# Patient Record
Sex: Female | Born: 1935
Health system: Southern US, Community
[De-identification: ages and names within clinical notes are randomized; demographics above are authoritative.]

## PROBLEM LIST (undated history)

## (undated) DIAGNOSIS — Z9849 Cataract extraction status, unspecified eye: Secondary | ICD-10-CM

## (undated) DIAGNOSIS — M810 Age-related osteoporosis without current pathological fracture: Secondary | ICD-10-CM

## (undated) DIAGNOSIS — E039 Hypothyroidism, unspecified: Secondary | ICD-10-CM

## (undated) DIAGNOSIS — M722 Plantar fascial fibromatosis: Secondary | ICD-10-CM

## (undated) DIAGNOSIS — J449 Chronic obstructive pulmonary disease, unspecified: Secondary | ICD-10-CM

## (undated) DIAGNOSIS — M19049 Primary osteoarthritis, unspecified hand: Secondary | ICD-10-CM

## (undated) DIAGNOSIS — G500 Trigeminal neuralgia: Secondary | ICD-10-CM

## (undated) DIAGNOSIS — Z86010 Personal history of colonic polyps: Secondary | ICD-10-CM

## (undated) DIAGNOSIS — I10 Essential (primary) hypertension: Secondary | ICD-10-CM

## (undated) DIAGNOSIS — E785 Hyperlipidemia, unspecified: Secondary | ICD-10-CM

## (undated) DIAGNOSIS — Z9089 Acquired absence of other organs: Secondary | ICD-10-CM

## (undated) DIAGNOSIS — E119 Type 2 diabetes mellitus without complications: Secondary | ICD-10-CM

## (undated) DIAGNOSIS — I82409 Acute embolism and thrombosis of unspecified deep veins of unspecified lower extremity: Secondary | ICD-10-CM

## (undated) DIAGNOSIS — R51 Headache: Secondary | ICD-10-CM

## (undated) DIAGNOSIS — Z9079 Acquired absence of other genital organ(s): Secondary | ICD-10-CM

## (undated) HISTORY — PX: ABDOMINAL HYSTERECTOMY: SHX81

## (undated) HISTORY — DX: Trigeminal neuralgia: G50.0

## (undated) HISTORY — DX: Acquired absence of other organs: Z90.89

## (undated) HISTORY — DX: Hypothyroidism, unspecified: E03.9

## (undated) HISTORY — PX: CATARACT EXTRACTION: SUR2

## (undated) HISTORY — DX: Essential (primary) hypertension: I10

## (undated) HISTORY — DX: Headache: R51

## (undated) HISTORY — DX: Personal history of colonic polyps: Z86.010

## (undated) HISTORY — DX: Acquired absence of other genital organ(s): Z90.79

## (undated) HISTORY — DX: Age-related osteoporosis without current pathological fracture: M81.0

## (undated) HISTORY — DX: Hyperlipidemia, unspecified: E78.5

## (undated) HISTORY — PX: THYROIDECTOMY: SHX17

## (undated) HISTORY — DX: Chronic obstructive pulmonary disease, unspecified: J44.9

## (undated) HISTORY — DX: Acute embolism and thrombosis of unspecified deep veins of unspecified lower extremity: I82.409

## (undated) HISTORY — DX: Primary osteoarthritis, unspecified hand: M19.049

## (undated) HISTORY — DX: Cataract extraction status, unspecified eye: Z98.49

## (undated) HISTORY — DX: Type 2 diabetes mellitus without complications: E11.9

## (undated) HISTORY — DX: Plantar fascial fibromatosis: M72.2

---

## 1999-04-11 ENCOUNTER — Encounter: Admission: RE | Admit: 1999-04-11 | Discharge: 1999-04-11 | Payer: Self-pay | Admitting: Internal Medicine

## 1999-04-11 ENCOUNTER — Encounter: Payer: Self-pay | Admitting: Internal Medicine

## 2000-05-18 ENCOUNTER — Encounter: Payer: Self-pay | Admitting: Internal Medicine

## 2000-05-18 ENCOUNTER — Ambulatory Visit (HOSPITAL_COMMUNITY): Admission: RE | Admit: 2000-05-18 | Discharge: 2000-05-18 | Payer: Self-pay | Admitting: Internal Medicine

## 2001-04-04 ENCOUNTER — Encounter: Payer: Self-pay | Admitting: Internal Medicine

## 2001-04-04 ENCOUNTER — Encounter: Admission: RE | Admit: 2001-04-04 | Discharge: 2001-04-04 | Payer: Self-pay | Admitting: Internal Medicine

## 2002-03-13 ENCOUNTER — Inpatient Hospital Stay (HOSPITAL_COMMUNITY): Admission: EM | Admit: 2002-03-13 | Discharge: 2002-03-16 | Payer: Self-pay | Admitting: Internal Medicine

## 2002-03-13 ENCOUNTER — Encounter: Payer: Self-pay | Admitting: Internal Medicine

## 2002-11-02 ENCOUNTER — Encounter: Payer: Self-pay | Admitting: Internal Medicine

## 2002-11-02 ENCOUNTER — Encounter: Admission: RE | Admit: 2002-11-02 | Discharge: 2002-11-02 | Payer: Self-pay | Admitting: Internal Medicine

## 2003-06-13 ENCOUNTER — Ambulatory Visit (HOSPITAL_COMMUNITY): Admission: RE | Admit: 2003-06-13 | Discharge: 2003-06-13 | Payer: Self-pay | Admitting: Internal Medicine

## 2003-11-08 ENCOUNTER — Encounter: Admission: RE | Admit: 2003-11-08 | Discharge: 2003-11-08 | Payer: Self-pay | Admitting: Internal Medicine

## 2003-11-22 ENCOUNTER — Ambulatory Visit: Payer: Self-pay | Admitting: Internal Medicine

## 2003-11-27 ENCOUNTER — Ambulatory Visit: Payer: Self-pay | Admitting: Internal Medicine

## 2003-12-25 ENCOUNTER — Ambulatory Visit: Payer: Self-pay | Admitting: Internal Medicine

## 2004-02-27 ENCOUNTER — Emergency Department (HOSPITAL_COMMUNITY): Admission: EM | Admit: 2004-02-27 | Discharge: 2004-02-27 | Payer: Self-pay | Admitting: Emergency Medicine

## 2004-03-07 ENCOUNTER — Ambulatory Visit: Payer: Self-pay | Admitting: Internal Medicine

## 2004-07-29 ENCOUNTER — Ambulatory Visit: Payer: Self-pay | Admitting: Internal Medicine

## 2004-11-26 ENCOUNTER — Ambulatory Visit: Payer: Self-pay | Admitting: Internal Medicine

## 2004-12-02 ENCOUNTER — Ambulatory Visit: Payer: Self-pay | Admitting: Internal Medicine

## 2004-12-05 ENCOUNTER — Ambulatory Visit: Payer: Self-pay

## 2005-01-19 ENCOUNTER — Encounter: Admission: RE | Admit: 2005-01-19 | Discharge: 2005-01-19 | Payer: Self-pay | Admitting: Internal Medicine

## 2005-05-11 ENCOUNTER — Ambulatory Visit: Payer: Self-pay | Admitting: Internal Medicine

## 2005-05-12 ENCOUNTER — Ambulatory Visit: Payer: Self-pay | Admitting: Internal Medicine

## 2005-11-30 ENCOUNTER — Ambulatory Visit: Payer: Self-pay | Admitting: Internal Medicine

## 2005-12-02 ENCOUNTER — Ambulatory Visit: Payer: Self-pay | Admitting: Internal Medicine

## 2005-12-08 ENCOUNTER — Ambulatory Visit: Payer: Self-pay | Admitting: Internal Medicine

## 2005-12-17 ENCOUNTER — Ambulatory Visit: Payer: Self-pay | Admitting: Internal Medicine

## 2006-01-21 ENCOUNTER — Encounter: Admission: RE | Admit: 2006-01-21 | Discharge: 2006-01-21 | Payer: Self-pay | Admitting: Internal Medicine

## 2006-06-25 ENCOUNTER — Ambulatory Visit: Payer: Self-pay | Admitting: Internal Medicine

## 2006-06-25 LAB — CONVERTED CEMR LAB
ALT: 25 units/L (ref 0–40)
BUN: 37 mg/dL — ABNORMAL HIGH (ref 6–23)
CO2: 29 meq/L (ref 19–32)
Creatinine,U: 110.1 mg/dL
GFR calc Af Amer: 46 mL/min
GFR calc non Af Amer: 38 mL/min
Microalb, Ur: 0.2 mg/dL (ref 0.0–1.9)
Sodium: 138 meq/L (ref 135–145)

## 2006-07-14 ENCOUNTER — Ambulatory Visit: Payer: Self-pay | Admitting: Internal Medicine

## 2006-09-21 ENCOUNTER — Encounter: Payer: Self-pay | Admitting: Internal Medicine

## 2006-09-21 DIAGNOSIS — E039 Hypothyroidism, unspecified: Secondary | ICD-10-CM

## 2006-09-21 DIAGNOSIS — Z9079 Acquired absence of other genital organ(s): Secondary | ICD-10-CM

## 2006-09-21 DIAGNOSIS — E1159 Type 2 diabetes mellitus with other circulatory complications: Secondary | ICD-10-CM

## 2006-09-21 DIAGNOSIS — J4489 Other specified chronic obstructive pulmonary disease: Secondary | ICD-10-CM

## 2006-09-21 DIAGNOSIS — Z86718 Personal history of other venous thrombosis and embolism: Secondary | ICD-10-CM | POA: Insufficient documentation

## 2006-09-21 DIAGNOSIS — Z9089 Acquired absence of other organs: Secondary | ICD-10-CM

## 2006-09-21 DIAGNOSIS — E119 Type 2 diabetes mellitus without complications: Secondary | ICD-10-CM

## 2006-09-21 DIAGNOSIS — I10 Essential (primary) hypertension: Secondary | ICD-10-CM

## 2006-09-21 DIAGNOSIS — M810 Age-related osteoporosis without current pathological fracture: Secondary | ICD-10-CM

## 2006-09-21 DIAGNOSIS — Z860100 Personal history of colon polyps, unspecified: Secondary | ICD-10-CM

## 2006-09-21 DIAGNOSIS — Z9849 Cataract extraction status, unspecified eye: Secondary | ICD-10-CM

## 2006-09-21 DIAGNOSIS — J441 Chronic obstructive pulmonary disease with (acute) exacerbation: Secondary | ICD-10-CM | POA: Insufficient documentation

## 2006-09-21 DIAGNOSIS — E785 Hyperlipidemia, unspecified: Secondary | ICD-10-CM

## 2006-09-21 DIAGNOSIS — J449 Chronic obstructive pulmonary disease, unspecified: Secondary | ICD-10-CM

## 2006-09-21 DIAGNOSIS — M722 Plantar fascial fibromatosis: Secondary | ICD-10-CM

## 2006-09-21 DIAGNOSIS — I82409 Acute embolism and thrombosis of unspecified deep veins of unspecified lower extremity: Secondary | ICD-10-CM

## 2006-09-21 DIAGNOSIS — E1169 Type 2 diabetes mellitus with other specified complication: Secondary | ICD-10-CM | POA: Insufficient documentation

## 2006-09-21 DIAGNOSIS — E890 Postprocedural hypothyroidism: Secondary | ICD-10-CM

## 2006-09-21 HISTORY — DX: Hypothyroidism, unspecified: E03.9

## 2006-09-21 HISTORY — DX: Plantar fascial fibromatosis: M72.2

## 2006-09-21 HISTORY — DX: Age-related osteoporosis without current pathological fracture: M81.0

## 2006-09-21 HISTORY — DX: Chronic obstructive pulmonary disease, unspecified: J44.9

## 2006-09-21 HISTORY — DX: Cataract extraction status, unspecified eye: Z98.49

## 2006-09-21 HISTORY — DX: Hyperlipidemia, unspecified: E78.5

## 2006-09-21 HISTORY — DX: Personal history of colonic polyps: Z86.010

## 2006-09-21 HISTORY — DX: Type 2 diabetes mellitus without complications: E11.9

## 2006-09-21 HISTORY — DX: Acquired absence of other organs: Z90.89

## 2006-09-21 HISTORY — DX: Essential (primary) hypertension: I10

## 2006-09-21 HISTORY — DX: Other specified chronic obstructive pulmonary disease: J44.89

## 2006-09-21 HISTORY — DX: Personal history of colon polyps, unspecified: Z86.0100

## 2006-09-21 HISTORY — DX: Acquired absence of other genital organ(s): Z90.79

## 2006-09-21 HISTORY — DX: Acute embolism and thrombosis of unspecified deep veins of unspecified lower extremity: I82.409

## 2007-01-21 ENCOUNTER — Ambulatory Visit: Payer: Self-pay | Admitting: Internal Medicine

## 2007-01-21 LAB — CONVERTED CEMR LAB
ALT: 24 units/L (ref 0–35)
AST: 26 units/L (ref 0–37)
BUN: 24 mg/dL — ABNORMAL HIGH (ref 6–23)
Bilirubin, Direct: 0.1 mg/dL (ref 0.0–0.3)
CO2: 28 meq/L (ref 19–32)
Calcium: 10.4 mg/dL (ref 8.4–10.5)
Creatinine, Ser: 1.1 mg/dL (ref 0.4–1.2)
Direct LDL: 80.2 mg/dL
HDL: 37.7 mg/dL — ABNORMAL LOW (ref >39.0)
VLDL: 47 mg/dL — ABNORMAL HIGH (ref 0–40)

## 2007-01-23 ENCOUNTER — Encounter: Payer: Self-pay | Admitting: Internal Medicine

## 2007-01-26 ENCOUNTER — Encounter: Admission: RE | Admit: 2007-01-26 | Discharge: 2007-01-26 | Payer: Self-pay | Admitting: Internal Medicine

## 2007-06-13 ENCOUNTER — Ambulatory Visit: Payer: Self-pay | Admitting: Internal Medicine

## 2007-06-13 DIAGNOSIS — M19049 Primary osteoarthritis, unspecified hand: Secondary | ICD-10-CM

## 2007-06-13 DIAGNOSIS — R51 Headache: Secondary | ICD-10-CM

## 2007-06-13 DIAGNOSIS — R519 Headache, unspecified: Secondary | ICD-10-CM | POA: Insufficient documentation

## 2007-06-13 HISTORY — DX: Headache: R51

## 2007-06-13 HISTORY — DX: Primary osteoarthritis, unspecified hand: M19.049

## 2007-06-20 ENCOUNTER — Ambulatory Visit: Payer: Self-pay | Admitting: Internal Medicine

## 2007-06-20 LAB — CONVERTED CEMR LAB: Hgb A1c MFr Bld: 7.8 % — ABNORMAL HIGH (ref 4.6–6.0)

## 2007-06-27 ENCOUNTER — Encounter: Payer: Self-pay | Admitting: Internal Medicine

## 2008-01-23 ENCOUNTER — Ambulatory Visit: Payer: Self-pay | Admitting: Internal Medicine

## 2008-01-23 LAB — CONVERTED CEMR LAB
BUN: 24 mg/dL — ABNORMAL HIGH (ref 6–23)
Calcium: 10 mg/dL (ref 8.4–10.5)
Chloride: 105 meq/L (ref 96–112)
Creatinine, Ser: 0.8 mg/dL (ref 0.4–1.2)
GFR calc non Af Amer: 72 mL/min
Glucose, Bld: 117 mg/dL — ABNORMAL HIGH (ref 70–99)
Potassium: 5.3 meq/L — ABNORMAL HIGH (ref 3.5–5.1)

## 2008-02-01 ENCOUNTER — Encounter: Admission: RE | Admit: 2008-02-01 | Discharge: 2008-02-01 | Payer: Self-pay | Admitting: Internal Medicine

## 2008-10-01 ENCOUNTER — Ambulatory Visit: Payer: Self-pay | Admitting: Internal Medicine

## 2008-10-01 DIAGNOSIS — G500 Trigeminal neuralgia: Secondary | ICD-10-CM

## 2008-10-01 HISTORY — DX: Trigeminal neuralgia: G50.0

## 2009-01-28 ENCOUNTER — Ambulatory Visit: Payer: Self-pay | Admitting: Internal Medicine

## 2009-01-28 LAB — CONVERTED CEMR LAB
ALT: 27 units/L (ref 0–35)
Bilirubin, Direct: 0.1 mg/dL (ref 0.0–0.3)
CO2: 26 meq/L (ref 19–32)
Calcium: 10.1 mg/dL (ref 8.4–10.5)
Chloride: 105 meq/L (ref 96–112)
Creatinine, Ser: 1.4 mg/dL — ABNORMAL HIGH (ref 0.4–1.2)
GFR calc non Af Amer: 37.55 mL/min (ref >60)
Hgb A1c MFr Bld: 7.6 % — ABNORMAL HIGH (ref 4.6–6.5)
Total Bilirubin: 0.4 mg/dL (ref 0.3–1.2)
VLDL: 32 mg/dL (ref 0.0–40.0)

## 2009-02-04 ENCOUNTER — Encounter: Admission: RE | Admit: 2009-02-04 | Discharge: 2009-02-04 | Payer: Self-pay | Admitting: Internal Medicine

## 2009-02-04 ENCOUNTER — Encounter: Payer: Self-pay | Admitting: Internal Medicine

## 2009-02-11 ENCOUNTER — Telehealth: Payer: Self-pay | Admitting: Internal Medicine

## 2009-02-15 ENCOUNTER — Telehealth: Payer: Self-pay | Admitting: Internal Medicine

## 2009-03-21 ENCOUNTER — Telehealth: Payer: Self-pay | Admitting: Internal Medicine

## 2009-04-22 ENCOUNTER — Ambulatory Visit: Payer: Self-pay | Admitting: Internal Medicine

## 2009-04-22 LAB — CONVERTED CEMR LAB
Total CHOL/HDL Ratio: 4
Triglycerides: 340 mg/dL — ABNORMAL HIGH (ref 0.0–149.0)

## 2009-04-28 ENCOUNTER — Telehealth: Payer: Self-pay | Admitting: Internal Medicine

## 2009-05-02 ENCOUNTER — Telehealth: Payer: Self-pay | Admitting: Internal Medicine

## 2009-05-27 ENCOUNTER — Ambulatory Visit: Payer: Self-pay | Admitting: Internal Medicine

## 2009-12-26 ENCOUNTER — Encounter: Payer: Self-pay | Admitting: Internal Medicine

## 2009-12-26 ENCOUNTER — Ambulatory Visit: Payer: Self-pay | Admitting: Internal Medicine

## 2009-12-26 LAB — CONVERTED CEMR LAB
Calcium: 10.7 mg/dL — ABNORMAL HIGH (ref 8.4–10.5)
Direct LDL: 92.1 mg/dL
Glucose, Bld: 166 mg/dL — ABNORMAL HIGH (ref 70–99)
HDL: 41.3 mg/dL (ref >39.00)
Sodium: 142 meq/L (ref 135–145)
TSH: 0.82 microintl units/mL (ref 0.35–5.50)
Total CHOL/HDL Ratio: 4
VLDL: 43.6 mg/dL — ABNORMAL HIGH (ref 0.0–40.0)

## 2010-02-05 ENCOUNTER — Encounter
Admission: RE | Admit: 2010-02-05 | Discharge: 2010-02-05 | Payer: Self-pay | Source: Home / Self Care | Attending: Internal Medicine | Admitting: Internal Medicine

## 2010-02-05 LAB — HM MAMMOGRAPHY: HM Mammogram: NEGATIVE

## 2010-02-13 NOTE — Assessment & Plan Note (Signed)
Summary: YEARLY FU/ MEDICARE / LABS LATER/ NWS   Vital Signs:  Patient profile:   77 year old female Height:      63 inches Weight:      139 pounds BMI:     24.71 O2 Sat:      95 % on Room air Temp:     98.0 degrees F oral Pulse rate:   81 / minute BP sitting:   190 / 64  (left arm) Cuff size:   regular  Vitals Entered By: Bill Salinas CMA (December 26, 2009 10:09 AM)  O2 Flow:  Room air  Primary Care Provider:  Elnor Renovato   History of Present Illness: Patient presents for a wellness exam. She has turned 89. She remains independent and very active. She reports some memory issues but manages all her own affairs, remembers birthdays and can name all 18 grandchildren and 16 great-grandchildren. She is cognitively doing well  She has a c/o paresthesia of the entire left arm that can last up to 3 hrs and can occur several times a day. She does drop objects when her arm is numb. She denies any neck pain. She does have OA in both hands that contributes to her poor grasp.  She does remain independent in ADLs except for driving. She has no signs of depression. She has not had any falls although she is at risk due to age and arthritis. Can walk to the mailbox and back - 100yds or more.   Preventive Screening-Counseling & Management  Alcohol-Tobacco     Alcohol drinks/day: 0     Smoking Status: quit     Year Quit: 1970s  Caffeine-Diet-Exercise     Caffeine use/day: 2 cups per day     Does Patient Exercise: yes  Hep-HIV-STD-Contraception     Hepatitis Risk: no risk noted     HIV Risk: no risk noted     STD Risk: no risk noted     Dental Visit-last 6 months no     Sun Exposure-Excessive: no  Safety-Violence-Falls     Seat Belt Use: yes     Helmet Use: n/a     Firearms in the Home: no firearms in the home     Smoke Detectors: yes     Violence in the Home: no risk noted     Sexual Abuse: no     Fall Risk: slight fall risk      Drug Use:  never.        Blood Transfusions:  yes  and prior to 1987.    Current Medications (verified): 1)  Synthroid 100 Mcg  Tabs (Levothyroxine Sodium) .... Take One Tablet Daily 2)  Humulin 70/30 70-30 %  Susp (Insulin Isophane & Regular) .... Take 35 Units Am 20 Units Pm 3)  Furosemide 40 Mg  Tabs (Furosemide) .... Take One Tablet Daily 4)  Advair Diskus 250-50 Mcg/dose  Misc (Fluticasone-Salmeterol) .... Take One Puff Daily 5)  Aspirin Low Dose 81 Mg Tbec (Aspirin) .... Three Aspirins A Day 6)  Oscal 500/200 D-3 500-200 Mg-Unit  Tabs (Calcium-Vitamin D) .... Take One Tablet Once Daily 7)  Multivitamins   Tabs (Multiple Vitamin) .... Take One Tablet Once Daily 8)  Vitamin E 400 Unit  Caps (Vitamin E) .... Take One Tablet Once Daily 9)  Amitriptyline Hcl 25 Mg  Tabs (Amitriptyline Hcl) .... Take 1-2 Tablets At Bedtime As Needed 10)  Biotin 1000 Mcg  Tabs (Biotin) .Marland Kitchen.. 1 Once Daily 11)  Bd Insulin Syringe  Ultrafine 30g X 1/2" 0.5 Ml  Misc (Insulin Syringe-Needle U-100) .... Use As Directed 12)  Onetouch Ultra Test   Strp (Glucose Blood) .... Test Blood Glucose Levels Two Times A Day 13)  Omega-3 1000 Mg Caps (Omega-3 Fatty Acids) .... Take 1 Tablet By Mouth Once A Day 14)  Amlodipine Besylate 5 Mg Tabs (Amlodipine Besylate) .Marland Kitchen.. 1 By Mouth Once Daily 15)  Simvastatin 40 Mg Tabs (Simvastatin) .Marland Kitchen.. 1 Daily 16)  Diovan 160 Mg Tabs (Valsartan) .Marland Kitchen.. 1 Daily  Allergies (verified): 1)  ! Codeine  Past History:  Past Medical History: Last updated: 09/21/2006 OSTEOPOROSIS (ICD-733.00) Hx of PLANTAR FASCIITIS (ICD-728.71) Hx of DVT (ICD-453.40) COLONIC POLYPS, HX OF (ICD-V12.72) HYPERTENSION (ICD-401.9) HYPERLIPIDEMIA (ICD-272.4) HYPOTHYROIDISM (ICD-244.9) DIABETES MELLITUS (ICD-250.00) COPD (ICD-496)    Family History: Last updated: 01/21/2007 non-contributory  Social History: Last updated: 01/28/2009 Widowed. Lives alone, very independent, continues to garden, does all her own housework, avid reader [employer]  (01/28/09) in a discussion about the recent death of her sister she is very clear that she does not want heroic measures: no CPR, DNI, no other heroic or futile measures. Will send a packet with Out of facility order, "Layment's Guide," MOST form.  Social History: Caffeine use/day:  2 cups per day Does Patient Exercise:  yes Dental Care w/in 6 mos.:  no Sun Exposure-Excessive:  no Seat Belt Use:  yes Fall Risk:  slight fall risk Blood Transfusions:  yes, prior to 1987 Hepatitis Risk:  no risk noted HIV Risk:  no risk noted STD Risk:  no risk noted Drug Use:  never  Review of Systems  The patient denies anorexia, fever, weight loss, weight gain, vision loss, decreased hearing, chest pain, syncope, dyspnea on exertion, prolonged cough, headaches, abdominal pain, severe indigestion/heartburn, incontinence, muscle weakness, transient blindness, difficulty walking, unusual weight change, abnormal bleeding, and enlarged lymph nodes.    Physical Exam  General:  alert, well-developed, well-nourished, well-hydrated, and normal appearance nanogenarian.   Head:  normocephalic, atraumatic, and no abnormalities observed.   Eyes:  vision grossly intact, pupils equal, pupils round, and corneas and lenses clear.   Ears:  hearing aids in both ears Nose:  no external deformity and no external erythema.   Mouth:  edentulous with dentures in place. No buccal lesions. throat clear Neck:  supple and full ROM.   Chest Wall:  no deformities and no tenderness.   Breasts:  No mass, nodules, thickening, tenderness, bulging, retraction, inflamation, nipple discharge or skin changes noted.   Lungs:  Normal respiratory effort, chest expands symmetrically. Lungs are clear to auscultation, no crackles or wheezes. Heart:  normal rate, regular rhythm, no murmur, and no JVD.   Abdomen:  soft, non-tender, normal bowel sounds, and no guarding.   Msk:  deformity of DIP,PIP joints both hands. Good mobility of hands noted.  Major joints otherwise normal Pulses:  2+ raidal and 1+ DP pulses Extremities:  No clubbing, cyanosis, edema, or deformity noted with normal full range of motion of all joints.   Neurologic:  alert & oriented X3, cranial nerves II-XII intact, strength normal in all extremities, and DTRs symmetrical and normal.  Minimal decrease in light touch fingers of left hand. Equal sensation to pin-prick.  Skin:  turgor normal, color normal, and no suspicious lesions.   Cervical Nodes:  no anterior cervical adenopathy and no posterior cervical adenopathy.   Psych:  Oriented X3, memory intact for recent and remote, normally interactive, and good eye contact.  Clockface - poor layout of numbers got hands right.  recall 3/3 words at 5 minutes   Impression & Recommendations:  Problem # 1:  PRIMARY LOCALIZED OSTEOARTHROSIS HAND (ICD-715.14) patinet with DIP and PIP deformities. Normal ability tomake a fist and 4+/5 grip strenth.  Her updated medication list for this problem includes:    Aspirin Low Dose 81 Mg Tbec (Aspirin) .Marland Kitchen... Three aspirins a day  Problem # 2:  HYPERTENSION (ICD-401.9)  Her updated medication list for this problem includes:    Furosemide 40 Mg Tabs (Furosemide) .Marland Kitchen... Take one tablet daily    Amlodipine Besylate 5 Mg Tabs (Amlodipine besylate) .Marland Kitchen... 1 by mouth once daily    Diovan 160 Mg Tabs (Valsartan) .Marland Kitchen... 1 daily  Orders: TLB-BMP (Basic Metabolic Panel-BMET) (80048-METABOL)  BP today: 190/64 Prior BP: 126/82 (05/27/2009)  Labs Reviewed: K+: 4.2 (01/28/2009) Creat: : 1.4 (01/28/2009)     BP very high at today's exam but usually well controlled. She is on three agents.  Plan - observation: she should check BP at her convenience toinsure that it is usually controlled.  Problem # 3:  HYPERLIPIDEMIA (ICD-272.4) Due for lipid panel with recommendations to follow.  Her updated medication list for this problem includes:    Simvastatin 40 Mg Tabs (Simvastatin) .Marland Kitchen... 1  daily  Orders: TLB-Lipid Panel (80061-LIPID)  Addendum - LDL 91 consistent with good control.   Plan - continue present medications.  Problem # 4:  HYPOTHYROIDISM (ICD-244.9) Due for lab follow-up  Her updated medication list for this problem includes:    Synthroid 100 Mcg Tabs (Levothyroxine sodium) .Marland Kitchen... Take one tablet daily  Orders: TLB-TSH (Thyroid Stimulating Hormone) (84443-TSH)  Addendum- TSH is normal. Will continue present dose of medications.  Problem # 5:  DIABETES MELLITUS (ICD-250.00) Patient continues to administer her own insulin. She has had no hypoglycemic episodes. she has no significant end organ damage based onlabs. Due for A1C.  Her updated medication list for this problem includes:    Humulin 70/30 70-30 % Susp (Insulin isophane & regular) .Marland Kitchen... Take 35 units am 20 units pm    Aspirin Low Dose 81 Mg Tbec (Aspirin) .Marland Kitchen... Three aspirins a day    Diovan 160 Mg Tabs (Valsartan) .Marland Kitchen... 1 daily  Orders: TLB-A1C / Hgb A1C (Glycohemoglobin) (83036-A1C)  Addendum - A1C 8.4% - suboptimal control. Given her age and the dangers of hypoglycemia will continue her present dosing of insulin.   Problem # 6:  COPD (ICD-496)  Patient has no limitations in her ADLs. She voices no complaints about DOE. Spirometry today is read out as normal function for her age.   Plan - continue Advair.   Her updated medication list for this problem includes:    Advair Diskus 250-50 Mcg/dose Misc (Fluticasone-salmeterol) .Marland Kitchen... Take one puff daily  Orders: Spirometry w/Graph (94010)  Problem # 7:  Preventive Health Care (ICD-V70.0)  Interval history is unremarkable. Her physical exam is normal for her age. She does have a mildly decreased grip with left hand with the remander of her exam being normal. Lab results are good except for elevated A1C. She is current with mammography Jan '11, due for repeat study in Jan '13. Last colonoscopy May '00 and she does not need any additional or  repeat screening. Immunizations: Tetnus today; Flu today; Pneumonia vaccine Nov '05. 12 Lead EKG with PACs and PVCs without evidence of ischemia.  In summary - delightful woman who appears medically stable. She is spry and independent. Cognition is good for her  age: clockface was poorly drawn yet she was aware of this and she was able to accurately set time within the confines of her drawing. Good recall.   Orders: Medicare -1st Annual Wellness Visit [id number])  Complete Medication List: 1)  Synthroid 100 Mcg Tabs (Levothyroxine sodium) .... Take one tablet daily 2)  Humulin 70/30 70-30 % Susp (Insulin isophane & regular) .... Take 35 units am 20 units pm 3)  Furosemide 40 Mg Tabs (Furosemide) .... Take one tablet daily 4)  Advair Diskus 250-50 Mcg/dose Misc (Fluticasone-salmeterol) .... Take one puff daily 5)  Aspirin Low Dose 81 Mg Tbec (Aspirin) .... Three aspirins a day 6)  Oscal 500/200 D-3 500-200 Mg-unit Tabs (Calcium-vitamin d) .... Take one tablet once daily 7)  Multivitamins Tabs (Multiple vitamin) .... Take one tablet once daily 8)  Vitamin E 400 Unit Caps (Vitamin e) .... Take one tablet once daily 9)  Amitriptyline Hcl 25 Mg Tabs (Amitriptyline hcl) .... Take 1-2 tablets at bedtime as needed 10)  Biotin 1000 Mcg Tabs (Biotin) .Marland Kitchen.. 1 once daily 11)  Bd Insulin Syringe Ultrafine 30g X 1/2" 0.5 Ml Misc (Insulin syringe-needle u-100) .... Use as directed 12)  Onetouch Ultra Test Strp (Glucose blood) .... Test blood glucose levels two times a day 13)  Omega-3 1000 Mg Caps (Omega-3 fatty acids) .... Take 1 tablet by mouth once a day 14)  Amlodipine Besylate 5 Mg Tabs (Amlodipine besylate) .Marland Kitchen.. 1 by mouth once daily 15)  Simvastatin 40 Mg Tabs (Simvastatin) .Marland Kitchen.. 1 daily 16)  Diovan 160 Mg Tabs (Valsartan) .Marland Kitchen.. 1 daily  Other Orders: Flu Vaccine 29yrs + ([address]) Admin 1st Vaccine (09811) Tdap => 50yrs IM (91478) Admin of Any Addtl Vaccine (29562) EKG w/ Interpretation (93000)  Patient:  [name] Note: All result statuses are Final unless otherwise noted.  Tests: (1) BMP (METABOL)   Sodium                    142 mEq/L                   135-145   Potassium                 4.9 mEq/L                   3.5-5.1   Chloride                  102 mEq/L                   96-112   Carbon Dioxide            31 mEq/L                    19-32   Glucose              [H]  166 mg/dL                   13-08   BUN                  [H]  29 mg/dL                    6-57   Creatinine           [H]  1.4 mg/dL                   8.4-6.9   Calcium              [  H]  10.7 mg/dL                  1.6-10.9   GFR                  [L]  37.47 mL/min                >60.00  Tests: (2) Lipid Panel (LIPID)   Cholesterol               169 mg/dL                   6-045     ATP III Classification            Desirable:  < 200 mg/dL                    Borderline High:  200 - 239 mg/dL               High:  > = 240 mg/dL   Triglycerides        [H]  218.0 mg/dL                 4.0-981.1     Normal:  <150 mg/dL     Borderline High:  914 - 199 mg/dL   HDL                       78.29 mg/dL                 >56.21   VLDL Cholesterol     [H]  43.6 mg/dL                  3.0-86.5  CHO/HDL Ratio:  CHD Risk                             4                    Men          Women     1/2 Average Risk     3.4          3.3     Average Risk          5.0          4.4     2X Average Risk          9.6          7.1     3X Average Risk          15.0          11.0                           Tests: (3) Hemoglobin A1C (A1C)   Hemoglobin A1C       [H]  8.4 %                       4.6-6.5     Glycemic Control Guidelines for People with Diabetes:     Non Diabetic:  <6%     Goal of Therapy: <7%     Additional Action Suggested:  >8%   Tests: (4) TSH (TSH)   FastTSH                   0.82 uIU/mL  0.35-5.50  Tests: (5) Cholesterol LDL - Direct (DIRLDL)  Cholesterol LDL - Direct                             92.1  mg/dLPrescriptions: DIOVAN 160 MG TABS (VALSARTAN) 1 daily  #30 Tablet x 12   Entered and Authorized by:   Jacques Navy MD   Signed by:   Jacques Navy MD on 12/26/2009   Method used:   Electronically to        [facility] [address] [phone]* (retail)       [address]       [place], [place]  [address]       Ph: [phone]       Fax: [phone]   RxID:   [id number] AMLODIPINE BESYLATE 5 MG TABS (AMLODIPINE BESYLATE) 1 by mouth once daily  #30 x 12   Entered and Authorized by:   Jacques Navy MD   Signed by:   Jacques Navy MD on 12/26/2009   Method used:   Electronically to        [facility] [address] [phone]* (retail)       [address]       [place], [place]  [address]       Ph: [phone]       Fax: [phone]   RxID:   [id number] ONETOUCH ULTRA TEST   STRP (GLUCOSE BLOOD) Test blood glucose levels two times a day  #100 Each x 4   Entered and Authorized by:   Jacques Navy MD   Signed by:   Jacques Navy MD on 12/26/2009   Method used:   Electronically to        [facility] [address] [phone]* (retail)       [address]       [place], [place]  [address]       Ph: [phone]       Fax: [phone]   RxID:   [id number] BD INSULIN SYRINGE ULTRAFINE 30G X 1/2" 0.5 ML  MISC (INSULIN SYRINGE-NEEDLE U-100) use as directed  #100 x 11   Entered and Authorized by:   Jacques Navy MD   Signed by:   Jacques Navy MD on 12/26/2009   Method used:   Electronically to        [facility] [address] [phone]* (retail)       [address]       [place], [place]  [address]       Ph: [phone]       Fax: [phone]   RxID:   [id number] AMITRIPTYLINE HCL 25 MG  TABS (AMITRIPTYLINE HCL) take 1-2 tablets at bedtime as needed  #30 Tablet x 12   Entered and Authorized by:   Jacques Navy MD   Signed by:   Jacques Navy MD on 12/26/2009   Method used:   Electronically to        [facility] [address] [phone]* (retail)       [address]        [place], [place]  [address]       Ph: [phone]       Fax: [phone]   RxID:   [id number] ADVAIR DISKUS 250-50 MCG/DOSE  MISC (FLUTICASONE-SALMETEROL) take one puff daily  #60 x 12   Entered and Authorized by:   Jacques Navy MD  Signed by:   Jacques Navy MD on 12/26/2009   Method used:   Electronically to        [facility] [address] [phone]* (retail)       [address]       [place], [place]  [address]       Ph: [phone]       Fax: [phone]   RxID:   [id number] FUROSEMIDE 40 MG  TABS (FUROSEMIDE) take one tablet daily  #30 Tablet x 12   Entered and Authorized by:   Jacques Navy MD   Signed by:   Jacques Navy MD on 12/26/2009   Method used:   Electronically to        [facility] [address] [phone]* (retail)       [address]       [place], [place]  [address]       Ph: [phone]       Fax: [phone]   RxID:   [id number] HUMULIN 70/30 70-30 %  SUSP (INSULIN ISOPHANE & REGULAR) take 35 units am 20 units pm  #10 [place] x 1   Entered and Authorized by:   Jacques Navy MD   Signed by:   Jacques Navy MD on 12/26/2009   Method used:   Electronically to        [facility] [address] [phone]* (retail)       [address]       [place], [place]  [address]       Ph: [phone]       Fax: [phone]   RxID:   [id number] SYNTHROID 100 MCG  TABS (LEVOTHYROXINE SODIUM) take one tablet daily  #30 Tablet x 12   Entered and Authorized by:   Jacques Navy MD   Signed by:   Jacques Navy MD on 12/26/2009   Method used:   Electronically to        [facility] [address] [phone]* (retail)       [address]       [place], [place]  [address]       Ph: [phone]       Fax: [phone]   RxID:   [id number]    Orders Added: 1)  TLB-BMP (Basic Metabolic Panel-BMET) [80048-METABOL] 2)  TLB-Lipid Panel [80061-LIPID] 3)  TLB-A1C / Hgb A1C (Glycohemoglobin) [83036-A1C] 4)  TLB-TSH (Thyroid Stimulating Hormone) [84443-TSH] 5)  Flu Vaccine  64yrs + [82423] 6)  Admin 1st Vaccine [90471] 7)  Tdap => 16yrs IM [90715] 8)  Admin of Any Addtl Vaccine [90472] 9)  Est. Patient Level IV [[address]] 10)  Medicare -1st Annual Wellness Visit [G0438] 11)  EKG w/ Interpretation [93000] 12)  Spirometry w/Graph [94010]   Immunizations Administered:  Influenza Vaccine # 1:    Vaccine Type: Fluvax 3+    Site: right deltoid    Mfr: Sanofi Pasteur    Dose: 0.5 ml    Route: IM    Given by: Ami Bullins CMA    Exp. Date: 07/12/2010    Lot #: [id number]    VIS given: 08/06/09 version given December 26, 2009.  Tetanus Vaccine:    Vaccine Type: Tdap    Site: left deltoid    Mfr: GlaxoSmithKline    Dose: 0.5 ml    Route: IM    Given by: Ami Bullins CMA    Exp. Date: 11/01/2011    Lot #: [id number]    VIS given:  11/30/07 version given December 26, 2009.  Flu Vaccine Consent Questions:    Do you have a history of severe allergic reactions to this vaccine? no    Any prior history of allergic reactions to egg and/or gelatin? no    Do you have a sensitivity to the preservative Thimersol? no    Do you have a past history of Guillan-Barre Syndrome? no    Do you currently have an acute febrile illness? no    Have you ever had a severe reaction to latex? no    Vaccine information given and explained to patient? yes    Are you currently pregnant? no   Immunizations Administered:  Influenza Vaccine # 1:    Vaccine Type: Fluvax 3+    Site: right deltoid    Mfr: Sanofi Pasteur    Dose: 0.5 ml    Route: IM    Given by: Ami Bullins CMA    Exp. Date: 07/12/2010    Lot #: [id number]    VIS given: 08/06/09 version given December 26, 2009.  Tetanus Vaccine:    Vaccine Type: Tdap    Site: left deltoid    Mfr: GlaxoSmithKline    Dose: 0.5 ml    Route: IM    Given by: Ami Bullins CMA    Exp. Date: 11/01/2011    Lot #: [id number]    VIS given: 11/30/07 version given December 26, 2009.

## 2010-02-13 NOTE — Medication Information (Signed)
Summary: [employer]  [employer]   Imported By: Sherian Rein 02/19/2009 09:06:29  _____________________________________________________________________  External Attachment:    Type:   Image     Comment:   External Document

## 2010-02-13 NOTE — Assessment & Plan Note (Signed)
Summary: CPX/MEDICARE/#/CD   Vital Signs:  Patient profile:   77 year old female Height:      63 inches Weight:      138 pounds O2 Sat:      95 % on Room air Temp:     97.0 degrees F oral Pulse rate:   83 / minute BP sitting:   140 / 68  (left arm) Cuff size:   regular  Vitals Entered By: Bill Salinas CMA (January 28, 2009 10:43 AM)  O2 Flow:  Room air CC: pt here for cpx. she will get her flu shot today and she is due for a tetanus. Pt had an eye exam in June which she states was normal with no change/ ab   Primary Care Provider:  Auna Mikkelsen  CC:  pt here for cpx. she will get her flu shot today and she is due for a tetanus. Pt had an eye exam in June which she states was normal with no change/ ab.  History of Present Illness: Patient presents for routine follow-up.   At her last visit she was started on amlodipine for BP control.  she was also diagnosed with Trigeminal neuralgia and started on carbamazepine which did relieve the pain in her face/head. She feels the medication has affected her memory and cognitive ability. she has not had any head or facial pain and would like to stop the medication.   She does feel well but does report some gait instability for which she will use a cane. 89th birthday coming up with a big party planned where she will lead her 4 generations of offspring.  Current Medications (verified): 1)  Synthroid 100 Mcg  Tabs (Levothyroxine Sodium) .... Take One Tablet Daily 2)  Humulin 70/30 70-30 %  Susp (Insulin Isophane & Regular) .... Take 35 Units Am 20 Units Pm 3)  Vytorin 10-40 Mg  Tabs (Ezetimibe-Simvastatin) .... Take One Tablet Daily 4)  Atacand 32 Mg  Tabs (Candesartan Cilexetil) .... Take One Tablet Daily 5)  Furosemide 40 Mg  Tabs (Furosemide) .... Take One Tablet Daily 6)  Advair Diskus 250-50 Mcg/dose  Misc (Fluticasone-Salmeterol) .... Take One Puff Daily 7)  Aspirin Low Dose 81 Mg Tbec (Aspirin) .... Three Aspirins A Day 8)  Oscal 500/200  D-3 500-200 Mg-Unit  Tabs (Calcium-Vitamin D) .... Take One Tablet Once Daily 9)  Multivitamins   Tabs (Multiple Vitamin) .... Take One Tablet Once Daily 10)  Vitamin E 400 Unit  Caps (Vitamin E) .... Take One Tablet Once Daily 11)  Amitriptyline Hcl 25 Mg  Tabs (Amitriptyline Hcl) .... Take 1-2 Tablets At Bedtime As Needed 12)  Biotin 1000 Mcg  Tabs (Biotin) .Marland Kitchen.. 1 Once Daily 13)  Bd Insulin Syringe Ultrafine 30g X 1/2" 0.5 Ml  Misc (Insulin Syringe-Needle U-100) .... Use As Directed 14)  Onetouch Ultra Test   Strp (Glucose Blood) .... Test Blood Glucose Levels Two Times A Day 15)  Omega-3 1000 Mg Caps (Omega-3 Fatty Acids) .... Take 1 Tablet By Mouth Once A Day 16)  Carbamazepine 100 Mg Chew (Carbamazepine) .Marland Kitchen.. 1 By Mouth Once Daily X 5 Then 1 Two Times A Day For Trigeminal Neuralgia 17)  Amlodipine Besylate 5 Mg Tabs (Amlodipine Besylate) .Marland Kitchen.. 1 By Mouth Once Daily  Allergies (verified): 1)  ! Codeine  Past History:  Past Medical History: Last updated: 09/21/2006 OSTEOPOROSIS (ICD-733.00) Hx of PLANTAR FASCIITIS (ICD-728.71) Hx of DVT (ICD-453.40) COLONIC POLYPS, HX OF (ICD-V12.72) HYPERTENSION (ICD-401.9) HYPERLIPIDEMIA (ICD-272.4) HYPOTHYROIDISM (ICD-244.9) DIABETES MELLITUS (  ICD-250.00) COPD (ICD-496)    Past Surgical History: Last updated: 09/21/2006 CATARACT EXTRACTION, HX OF (ICD-V45.61) HYSTERECTOMY, HX OF (ICD-V45.77) THYROIDECTOMY, HX OF (ICD-V45.79)  Family History: Last updated: 01/21/2007 non-contributory  Social History: Last updated: 01/28/2009 Widowed. Lives alone, very independent, continues to garden, does all her own housework, avid reader [employer] (01/28/09) in a discussion about the recent death of her sister she is very clear that she does not want heroic measures: no CPR, DNI, no other heroic or futile measures. Will send a packet with Out of facility order, "Layment's Guide," MOST form.  Risk Factors: Smoking Status: quit  (09/21/2006)  Social History: Widowed. Lives alone, very independent, continues to garden, does all her own housework, avid reader [employer] (01/28/09) in a discussion about the recent death of her sister she is very clear that she does not want heroic measures: no CPR, DNI, no other heroic or futile measures. Will send a packet with Out of facility order, "Layment's Guide," MOST form.  Review of Systems  The patient denies anorexia, fever, weight loss, weight gain, decreased hearing, hoarseness, chest pain, dyspnea on exertion, peripheral edema, prolonged cough, abdominal pain, severe indigestion/heartburn, incontinence, muscle weakness, transient blindness, difficulty walking, unusual weight change, enlarged lymph nodes, and angioedema.         has some trouble with balance and leg weakness. She has not fallen. She does use a cane.   Physical Exam  General:  WNWD overweight white female looking younger than her 89 years.  Head:  Normocephalic and atraumatic without obvious abnormalities. No apparent alopecia or balding. Eyes:  vision grossly intact, pupils equal, pupils round, corneas and lenses clear, and no injection.   Ears:  External ear exam shows no significant lesions or deformities.  Otoscopic examination reveals clear canals, tympanic membranes are intact bilaterally without bulging, retraction, inflammation or discharge. Hearing is grossly normal bilaterally. Nose:  no external deformity and no external erythema.   Mouth:  edentualous with dentures. No buccal lesions. throat clear Neck:  supple, full ROM, and no masses.   Chest Wall:  no deformities.   Breasts:  deferred Lungs:  Normal respiratory effort, chest expands symmetrically. Lungs are clear to auscultation, no crackles or wheezes. Heart:  Irregular rhythm that is rate controlled. Feint I/VI murmur. Abdomen:  protruberant abdomen, soft with positive BS.  Msk:  normal ROM, no joint tenderness, no joint swelling, no  joint warmth, no joint deformities, and no joint instability.   Pulses:  2+ radial and DP pusles Extremities:  trace left pedal edema.   Neurologic:  alert & oriented X3, cranial nerves II-XII intact, strength normal in all extremities, gait normal, and DTRs symmetrical and normal.   Skin:  turgor normal, color normal, no rashes, no suspicious lesions, no ecchymoses, and no ulcerations.   Cervical Nodes:  no anterior cervical adenopathy and no posterior cervical adenopathy.   Psych:  Oriented X3, memory intact for recent and remote, normally interactive, and good eye contact.     Impression & Recommendations:  Problem # 1:  NEURALGIA, TRIGEMINAL (ICD-350.1) Symptoms are resolved. OK to stop medication.  Plan - carbamazepine to once a day for 10 days and then off.  Problem # 2:  HYPERTENSION (ICD-401.9)  Her updated medication list for this problem includes:    Atacand 32 Mg Tabs (Candesartan cilexetil) .Marland Kitchen... Take one tablet daily    Furosemide 40 Mg Tabs (Furosemide) .Marland Kitchen... Take one tablet daily    Amlodipine Besylate 5  Mg Tabs (Amlodipine besylate) .Marland Kitchen... 1 by mouth once daily  Orders: TLB-BMP (Basic Metabolic Panel-BMET) (80048-METABOL)  BP today: 140/68 Prior BP: 148/58 (10/01/2008)  Good control of BP. Plan - continue medication.  Problem # 3:  HYPERLIPIDEMIA (ICD-272.4) for lab work with recommendations to follow.  Her updated medication list for this problem includes:    Vytorin 10-40 Mg Tabs (Ezetimibe-simvastatin) .Marland Kitchen... Take one tablet daily  Orders: TLB-Lipid Panel (80061-LIPID) TLB-Hepatic/Liver Function Pnl (80076-HEPATIC)  Addendm - LDL is 70 - great control of cholesterol  Problem # 4:  HYPOTHYROIDISM (ICD-244.9) For lab with recommendations to follow  Her updated medication list for this problem includes:    Synthroid 100 Mcg Tabs (Levothyroxine sodium) .Marland Kitchen... Take one tablet daily  Addendum = TSH 0.56 OK. Continue present medication  Problem # 5:   DIABETES MELLITUS (ICD-250.00) Tolerating medications. She does have occasionally has low blood sugars - she checks CBG at bedtime, if it is lower than 200 she will take a snack. If she awakens in a sweat she will take a snack. This occurs once every 2-3 weeks.  Plan - will check A1C.           She may need to reduce PM insulin to 15 if A1C is close to goal.  Her updated medication list for this problem includes:    Humulin 70/30 70-30 % Susp (Insulin isophane & regular) .Marland Kitchen... Take 35 units am 20 units pm    Atacand 32 Mg Tabs (Candesartan cilexetil) .Marland Kitchen... Take one tablet daily    Aspirin Low Dose 81 Mg Tbec (Aspirin) .Marland Kitchen... Three aspirins a day  Orders: TLB-A1C / Hgb A1C (Glycohemoglobin) (83036-A1C)  Addendum - A1C 7.6% - will not change medication or dosages. Recommend a bedtime snack every night.   Problem # 6:  COPD (ICD-496) Doing very well with advair.  Plan - continue present medications.   Her updated medication list for this problem includes:    Advair Diskus 250-50 Mcg/dose Misc (Fluticasone-salmeterol) .Marland Kitchen... Take one puff daily  Problem # 7:  Preventive Health Care (ICD-V70.0) Normal exam in a very chipper 77 year old. Lab results look fine. Current with mammography. Last colonoscopy '00. She is current with pneumovax and flu vaccine.   In summary - a delightful woman in very good health for her age. Will recommend regular bedtime snack to avoid early AM hypoglycemia. She will return as needed or 6 months.   Complete Medication List: 1)  Synthroid 100 Mcg Tabs (Levothyroxine sodium) .... Take one tablet daily 2)  Humulin 70/30 70-30 % Susp (Insulin isophane & regular) .... Take 35 units am 20 units pm 3)  Vytorin 10-40 Mg Tabs (Ezetimibe-simvastatin) .... Take one tablet daily 4)  Atacand 32 Mg Tabs (Candesartan cilexetil) .... Take one tablet daily 5)  Furosemide 40 Mg Tabs (Furosemide) .... Take one tablet daily 6)  Advair Diskus 250-50 Mcg/dose Misc  (Fluticasone-salmeterol) .... Take one puff daily 7)  Aspirin Low Dose 81 Mg Tbec (Aspirin) .... Three aspirins a day 8)  Oscal 500/200 D-3 500-200 Mg-unit Tabs (Calcium-vitamin d) .... Take one tablet once daily 9)  Multivitamins Tabs (Multiple vitamin) .... Take one tablet once daily 10)  Vitamin E 400 Unit Caps (Vitamin e) .... Take one tablet once daily 11)  Amitriptyline Hcl 25 Mg Tabs (Amitriptyline hcl) .... Take 1-2 tablets at bedtime as needed 12)  Biotin 1000 Mcg Tabs (Biotin) .Marland Kitchen.. 1 once daily 13)  Bd Insulin Syringe Ultrafine 30g X 1/2" 0.5 Ml Misc (  Insulin syringe-needle u-100) .... Use as directed 14)  Onetouch Ultra Test Strp (Glucose blood) .... Test blood glucose levels two times a day 15)  Omega-3 1000 Mg Caps (Omega-3 fatty acids) .... Take 1 tablet by mouth once a day 16)  Carbamazepine 100 Mg Chew (Carbamazepine) .Marland Kitchen.. 1 by mouth once daily x 5 then 1 two times a day for trigeminal neuralgia 17)  Amlodipine Besylate 5 Mg Tabs (Amlodipine besylate) .Marland Kitchen.. 1 by mouth once daily  Other Orders: Flu Vaccine 70yrs + (62130) Administration Flu vaccine - MCR (G0008) TLB-TSH (Thyroid Stimulating Hormone) (86578-ION)  Patient: [name] Note: All result statuses are Final unless otherwise noted.  Tests: (1) TSH (TSH)   FastTSH                   0.56 uIU/mL                 0.35-5.50  Tests: (2) BMP (METABOL)   Sodium               [H]  147 mEq/L                   135-145   Potassium                 4.2 mEq/L                   3.5-5.1   Chloride                  105 mEq/L                   96-112   Carbon Dioxide            26 mEq/L                    19-32   Glucose                   75 mg/dL                    62-95   BUN                  [H]  32 mg/dL                    2-84   Creatinine           [H]  1.4 mg/dL                   1.3-2.4   Calcium                   10.1 mg/dL                  4.0-10.2   GFR                       37.55 mL/min                >60  Tests:  (3) Hemoglobin A1C (A1C)   Hemoglobin A1C       [H]  7.6 %                       4.6-6.5     Glycemic Control Guidelines for People with Diabetes:     Non Diabetic:  <6%     Goal of Therapy: <7%  Additional Action Suggested:  >8%   Tests: (4) Lipid Panel (LIPID)   Cholesterol               150 mg/dL                   1-610     ATP III Classification            Desirable:  < 200 mg/dL                    Borderline High:  200 - 239 mg/dL               High:  > = 240 mg/dL   Triglycerides        [H]  160.0 mg/dL                 9.6-045.4     Normal:  <150 mg/dL     Borderline High:  098 - 199 mg/dL   HDL                       11.91 mg/dL                 >47.82   VLDL Cholesterol          32.0 mg/dL                  9.5-62.1   LDL Cholesterol           70 mg/dL                    3-08  CHO/HDL Ratio:  CHD Risk                             3                    Men          Women     1/2 Average Risk     3.4          3.3     Average Risk          5.0          4.4     2X Average Risk          9.6          7.1     3X Average Risk          15.0          11.0                           Tests: (5) Hepatic/Liver Function Panel (HEPATIC)   Total Bilirubin           0.4 mg/dL                   6.5-7.8   Direct Bilirubin          0.1 mg/dL                   4.6-9.6   Alkaline Phosphatase      91 U/L                      39-117   AST  29 U/L                      0-37   ALT                       27 U/L                      0-35   Total Protein             7.9 g/dL                    1.6-1.0   Albumin                   4.1 g/dL                    9.6-0.4   Patient Instructions: 1)  Head and face pain (trigeminal neuralgia) - seems to have resolved. Cut carbamazepine to once a day for 10 days and then stop. Let me kinow if the face/head pain comes back. 2)  Diabetes - will check lab today - if the A1C is close to 7% we may reduce PM insulin to 15 units, I will let you  know. 3)  Blood pressure is OK 4)  Cholesterol - will check lab today.  5)  Overall health - looks like you are doing well. Have a Happy Birthday - #90. Prescriptions: AMLODIPINE BESYLATE 5 MG TABS (AMLODIPINE BESYLATE) 1 by mouth once daily  #30 x 12   Entered and Authorized by:   Jacques Navy MD   Signed by:   Jacques Navy MD on 01/28/2009   Method used:   Electronically to        [facility]  [address] [id number] (retail)       [address]       [place], [place]  [address]       Ph: [phone] or [phone]       Fax: [phone]   RxID:   [id number] AMITRIPTYLINE HCL 25 MG  TABS (AMITRIPTYLINE HCL) take 1-2 tablets at bedtime as needed  #30 Tablet x 12   Entered and Authorized by:   Jacques Navy MD   Signed by:   Jacques Navy MD on 01/28/2009   Method used:   Electronically to        [facility]  [address] [facility] (retail)       [address]       [place], [place]  [address]       Ph: [phone] or [phone]       Fax: [phone]   RxID:   [id number] ADVAIR DISKUS 250-50 MCG/DOSE  MISC (FLUTICASONE-SALMETEROL) take one puff daily  #60 x 12   Entered and Authorized by:   Jacques Navy MD   Signed by:   Jacques Navy MD on 01/28/2009   Method used:   Electronically to        [facility]  [address] (retail)       [address]       [place], [place]  [address]       Ph: [phone] or [phone]       Fax: [phone]   RxID:   [id number] FUROSEMIDE 40 MG  TABS (FUROSEMIDE)  take one tablet daily  #30 Tablet x 12   Entered and Authorized by:   Jacques Navy MD   Signed by:   Jacques Navy MD on 01/28/2009   Method used:   Electronically to        [facility]  [address] [facility] ([facility]ail)       [address]       [place], [place]  [address]       Ph: [phone] or [phone]       Fax: [phone]   RxID:   [id number] ATACAND 32 MG  TABS (CANDESARTAN  CILEXETIL) take one tablet daily  #30 Tablet x 12   Entered and Authorized by:   Jacques Navy MD   Signed by:   Jacques Navy MD on 01/28/2009   Method used:   Electronically to        [facility]  [address] ([facility]ail)       [address]       [place], [place]  [address]       Ph: [phone] or [phone]       Fax: [phone]   RxID:   [id number] VYTORIN 10-40 MG  TABS (EZETIMIBE-SIMVASTATIN) take one tablet daily  #30 Tablet x 12   Entered and Authorized by:   Jacques Navy MD   Signed by:   Jacques Navy MD on 01/28/2009   Method used:   Electronically to        [facility]  [address] ([facility]ail)       [address]       [place], [place]  [address]       Ph: [phone] or [phone]       Fax: [phone]   RxID:   [id number] HUMULIN 70/30 70-30 %  SUSP (INSULIN ISOPHANE & REGULAR) take 35 units am 20 units pm  #10 Millilite x 12   Entered and Authorized by:   Jacques Navy MD   Signed by:   Jacques Navy MD on 01/28/2009   Method used:   Electronically to        [facility]  [address] [facility] ([facility]ail)       [address]       [place], [place]  [address]       Ph: [phone] or [phone]       Fax: [phone]   RxID:   [id number] SYNTHROID 100 MCG  TABS (LEVOTHYROXINE SODIUM) take one tablet daily  #30 Tablet x 12   Entered and Authorized by:   Jacques Navy MD   Signed by:   Jacques Navy MD on 01/28/2009   Method used:   Electronically to        [facility]  [address] [facility] ([facility]ail)       [address]       [place], [place]  [address]       Ph: [phone] or [phone]       Fax: [phone]   RxID:   [id number] ONETOUCH ULTRA TEST   STRP (GLUCOSE BLOOD) Test blood glucose levels two times a day  #100 Each x 4   Entered and Authorized by:   Jacques Navy MD   Signed by:   Rosalyn Gess  Claretta Kendra MD on 01/28/2009   Method used:    Electronically to        [facility] [phone]* (retail)       [address]       [place], [place]  [address]       Ph: [phone]       Fax: [phone]   RxID:   [id number] BD INSULIN SYRINGE ULTRAFINE 30G X 1/2" 0.5 ML  MISC (INSULIN SYRINGE-NEEDLE U-100) use as directed  #100 x 11   Entered and Authorized by:   Jacques Navy MD   Signed by:   Jacques Navy MD on 01/28/2009   Method used:   Electronically to        [facility] [phone]* (retail)       [address]       [place], [place]  [address]       Ph: [phone]       Fax: [phone]   RxID:   [id number] AMLODIPINE BESYLATE 5 MG TABS (AMLODIPINE BESYLATE) 1 by mouth once daily  #30 x 12   Entered and Authorized by:   Jacques Navy MD   Signed by:   Jacques Navy MD on 01/28/2009   Method used:   Electronically to        [facility] [phone]* (retail)       [address]       [place], [place]  [address]       Ph: [phone]       Fax: [phone]   RxID:   [id number] AMITRIPTYLINE HCL 25 MG  TABS (AMITRIPTYLINE HCL) take 1-2 tablets at bedtime as needed  #30 Tablet x 12   Entered and Authorized by:   Jacques Navy MD   Signed by:   Jacques Navy MD on 01/28/2009   Method used:   Electronically to        [facility] [address] [phone]* (retail)       [address]       [place], [place]  [address]       Ph: [phone]       Fax: [phone]   RxID:   [id number] FUROSEMIDE 40 MG  TABS (FUROSEMIDE) take one tablet daily  #30 Tablet x 12   Entered and Authorized by:   Jacques Navy MD   Signed by:   Jacques Navy MD on 01/28/2009   Method used:   Electronically to        [facility] [address] [phone]* (retail)       [address]       [place], [place]  [address]       Ph: [phone]       Fax: [phone]   RxID:   [id number] ATACAND 32 MG  TABS (CANDESARTAN CILEXETIL) take one tablet daily  #30 Tablet x 12   Entered and Authorized by:   Jacques Navy MD   Signed by:   Jacques Navy MD on 01/28/2009   Method used:   Electronically to        [facility] [address] [phone]* (retail)       [address]       [place], [place]  [address]       Ph: [phone]       Fax: [phone]   RxID:   [id number] VYTORIN 10-40 MG  TABS (EZETIMIBE-SIMVASTATIN) take one  tablet daily  #30 Tablet x 12   Entered and Authorized by:   Jacques Navy MD   Signed by:   Jacques Navy MD on 01/28/2009   Method used:   Electronically to        [facility] [address] [phone]* (retail)       [address]       [place], [place]  [address]       Ph: [phone]       Fax: [phone]   RxID:   [id number] HUMULIN 70/30 70-30 %  SUSP (INSULIN ISOPHANE & REGULAR) take 35 units am 20 units pm  #10 Millilite x 6   Entered and Authorized by:   Jacques Navy MD   Signed by:   Jacques Navy MD on 01/28/2009   Method used:   Electronically to        [facility] [address] [phone]* (retail)       [address]       [place], [place]  [address]       Ph: [phone]       Fax: [phone]   RxID:   [id number]     Flu Vaccine Consent Questions     Do you have a history of severe allergic reactions to this vaccine? no    Any prior history of allergic reactions to egg and/or gelatin? no    Do you have a sensitivity to the preservative Thimersol? no    Do you have a past history of Guillan-Barre Syndrome? no    Do you currently have an acute febrile illness? no    Have you ever had a severe reaction to latex? no    Vaccine information given and explained to patient? yes    Are you currently pregnant? no    Lot Number:[id number]   Exp Date:07/11/2009   Site Given  Left Deltoid IMflu

## 2010-02-13 NOTE — Progress Notes (Signed)
Summary: labs  Phone Note Call from Patient Call back at Home Phone [phone]   Summary of Call: Spoke with the patient about scheduling labs. She is just now starting the med and will call back to schedule labs. Initial call taken by: Lucious Groves,  March 21, 2009 3:12 PM

## 2010-02-13 NOTE — Progress Notes (Signed)
Summary: Simvastatin  Phone Note Outgoing Call   Summary of Call: reviewed chart and labs. It is ok to try simvastatin 40mg  as monotherapy. Repeat lipid panel 4 weeks after making the change. Initial call taken by: Jacques Navy MD,  February 15, 2009 10:33 AM  Follow-up for Phone Call        Patient notified. Follow-up by: Lucious Groves,  February 15, 2009 4:05 PM

## 2010-02-13 NOTE — Progress Notes (Signed)
  Phone Note Outgoing Call   Reason for Call: Discuss lab or test results Summary of Call: please call patient: cholesterol remains well controlled on simvastatin as single agent with LDL 90  Thanks Initial call taken by: Jacques Navy MD,  April 28, 2009 4:39 PM  Follow-up for Phone Call        informed pt Follow-up by: Ami Bullins CMA,  April 29, 2009 9:52 AM

## 2010-02-13 NOTE — Progress Notes (Signed)
Summary: PA-Change to generic  Phone Note Call from Patient   Complaint: Urinary/GYN Problems Summary of Call: Pt recieved a letter from insurance that says she needs to change her meds. Need to call pt for more details.  Initial call taken by: Lamar Sprinkles, CMA,  February 11, 2009 10:13 AM  Follow-up for Phone Call        Left message on answering machine to call the office. Follow-up by: Josph Macho CMA,  February 12, 2009 2:34 PM  Additional Follow-up for Phone Call Additional follow up Details #1::        pt called to inform that medications Vytorin and Atacand requeire authorizations because it is a ste therapy drug. I called number pt provided [phone] and requested paperwork MD would need to fill out in order for pt to receive medication. Dx provided were 401.9 and 242.4 respectfully. Additional Follow-up by: Margaret Pyle, CMA,  February 12, 2009 3:19 PM    Additional Follow-up for Phone Call Additional follow up Details #2::    Paperwork given to [facility].  pending MD completion. Lucious Groves  February 12, 2009 3:51 PM  Follow-up by: Josph Macho CMA,  February 12, 2009 3:48 PM  Additional Follow-up for Phone Call Additional follow up Details #3:: Details for Additional Follow-up Action Taken: Per MD patient will change to Diovan and Simvastatin 40mg . Would like patient on regular Diovan or Diovan/HCT (both are covered) and at what dosage? Please advise. Lucious Groves  February 15, 2009 10:40 AM   Diovan 160 Additional Follow-up by: Jacques Navy MD,  February 15, 2009 3:30 PM  New/Updated Medications: DIOVAN 160 MG TABS (VALSARTAN) 1 by mouth daily SIMVASTATIN 40 MG TABS (SIMVASTATIN) 1 by mouth daily Prescriptions: SIMVASTATIN 40 MG TABS (SIMVASTATIN) 1 by mouth daily  #30 x 3   Entered by:   Lucious Groves   Authorized by:   Jacques Navy MD   Signed by:   Lucious Groves on 02/15/2009   Method used:   Electronically to        [facility]  [phone]* (retail)       [address]       [place], [place]  [address]       Ph: [phone]       Fax: [phone]   RxID:   [id number] DIOVAN 160 MG TABS (VALSARTAN) 1 by mouth daily  #30 x 3   Entered by:   Lucious Groves   Authorized by:   Jacques Navy MD   Signed by:   Lucious Groves on 02/15/2009   Method used:   Electronically to        [facility] [phone]* (retail)       [address]       [place], [place]  [address]       Ph: [phone]       Fax: [phone]   RxID:   [id number]   Patient notified. Lucious Groves  February 15, 2009 4:04 PM

## 2010-02-13 NOTE — Medication Information (Signed)
Summary: Vytorin[facility]  Vytorin[facility]   Imported By: Sherian Rein 02/19/2009 09:07:32  _____________________________________________________________________  External Attachment:    Type:   Image     Comment:   External Document

## 2010-02-13 NOTE — Progress Notes (Signed)
Summary: RT arm pain  Phone Note Call from Patient   Caller: Janae Sauce [phone] Summary of Call: Pt's son called to inform MD that pt has RT are pain, arm is swollen and blue per pt. Pt's son is requesting OV with MEN today. Okay to sch? Initial call taken by: Margaret Pyle, CMA,  May 02, 2009 9:30 AM  Follow-up for Phone Call        OK to add on Follow-up by: Jacques Navy MD,  May 02, 2009 1:17 PM  Additional Follow-up for Phone Call Additional follow up Details #1::        Son took to pt to [facility]. Pt had doppler and she is neg for blood clot. Pt was advised heating pad and rest. Will f/u next week if no better Additional Follow-up by: Lamar Sprinkles, CMA,  May 02, 2009 2:05 PM    Additional Follow-up for Phone Call Additional follow up Details #2::    k Follow-up by: Jacques Navy MD,  May 02, 2009 2:10 PM

## 2010-02-13 NOTE — Letter (Signed)
Summary: [facility]  [facility]   Imported By: Sherian Rein 12/30/2009 11:25:25  _____________________________________________________________________  External Attachment:    Type:   Image     Comment:   External Document

## 2010-02-13 NOTE — Assessment & Plan Note (Signed)
Summary: both legs swollen/cd   Vital Signs:  Patient profile:   77 year old female Height:      63 inches Weight:      141 pounds BMI:     25.07 O2 Sat:      95 % on Room air Temp:     97.1 degrees F oral Pulse rate:   83 / minute BP sitting:   126 / 82  (left arm) Cuff size:   regular  Vitals Entered By: Bill Salinas CMA (May 27, 2009 10:58 AM)  O2 Flow:  Room air CC: pt here with complaint of swelling in both legs x 2 weeks, she is not taking amitriptyline or carbamazepine/ pt has gotten confused on how to take her amlodipine and has been taking three a day instead of one tab daily/ ab   Primary Care Provider:  Norins  CC:  pt here with complaint of swelling in both legs x 2 weeks and she is not taking amitriptyline or carbamazepine/ pt has gotten confused on how to take her amlodipine and has been taking three a day instead of one tab daily/ ab.  History of Present Illness: Patient presents for evaluation of swelling in the legs. Started with a review of medications: she has been taking amolodipine 5mg  three times a day!!. She has continued to take atacand 32 mg daily (as per last OV 01/28/09), she has continued taking diovan 160, probably due to confusion with Rx mgt company requiring change to diovan. She is taking furosemide, synthroid, Advair as directed. She did successfully stop carbamazepine.   Current Medications (verified): 1)  Synthroid 100 Mcg  Tabs (Levothyroxine Sodium) .... Take One Tablet Daily 2)  Humulin 70/30 70-30 %  Susp (Insulin Isophane & Regular) .... Take 35 Units Am 20 Units Pm 3)  Furosemide 40 Mg  Tabs (Furosemide) .... Take One Tablet Daily 4)  Advair Diskus 250-50 Mcg/dose  Misc (Fluticasone-Salmeterol) .... Take One Puff Daily 5)  Aspirin Low Dose 81 Mg Tbec (Aspirin) .... Three Aspirins A Day 6)  Oscal 500/200 D-3 500-200 Mg-Unit  Tabs (Calcium-Vitamin D) .... Take One Tablet Once Daily 7)  Multivitamins   Tabs (Multiple Vitamin) .... Take One  Tablet Once Daily 8)  Vitamin E 400 Unit  Caps (Vitamin E) .... Take One Tablet Once Daily 9)  Amitriptyline Hcl 25 Mg  Tabs (Amitriptyline Hcl) .... Take 1-2 Tablets At Bedtime As Needed 10)  Biotin 1000 Mcg  Tabs (Biotin) .Marland Kitchen.. 1 Once Daily 11)  Bd Insulin Syringe Ultrafine 30g X 1/2" 0.5 Ml  Misc (Insulin Syringe-Needle U-100) .... Use As Directed 12)  Onetouch Ultra Test   Strp (Glucose Blood) .... Test Blood Glucose Levels Two Times A Day 13)  Omega-3 1000 Mg Caps (Omega-3 Fatty Acids) .... Take 1 Tablet By Mouth Once A Day 14)  Carbamazepine 100 Mg Chew (Carbamazepine) .Marland Kitchen.. 1 By Mouth Once Daily X 5 Then 1 Two Times A Day For Trigeminal Neuralgia 15)  Amlodipine Besylate 5 Mg Tabs (Amlodipine Besylate) .Marland Kitchen.. 1 By Mouth Once Daily 16)  Simvastatin 40 Mg Tabs (Simvastatin) .Marland Kitchen.. 1 Daily 17)  Diovan 160 Mg Tabs (Valsartan) .Marland Kitchen.. 1 Daily  Allergies (verified): 1)  ! Codeine  Past History:  Past Medical History: Last updated: 09/21/2006 OSTEOPOROSIS (ICD-733.00) Hx of PLANTAR FASCIITIS (ICD-728.71) Hx of DVT (ICD-453.40) COLONIC POLYPS, HX OF (ICD-V12.72) HYPERTENSION (ICD-401.9) HYPERLIPIDEMIA (ICD-272.4) HYPOTHYROIDISM (ICD-244.9) DIABETES MELLITUS (ICD-250.00) COPD (ICD-496)    Past Surgical History: Last updated: 09/21/2006 CATARACT EXTRACTION,  HX OF (ICD-V45.61) HYSTERECTOMY, HX OF (ICD-V45.77) THYROIDECTOMY, HX OF (ICD-V45.79)  Social History: Last updated: 01/28/2009 Widowed. Lives alone, very independent, continues to garden, does all her own housework, avid reader [employer] (01/28/09) in a discussion about the recent death of her sister she is very clear that she does not want heroic measures: no CPR, DNI, no other heroic or futile measures. Will send a packet with Out of facility order, "Layment's Guide," MOST form.  Review of Systems  The patient denies anorexia, fever, weight loss, weight gain, hoarseness, chest pain, dyspnea on exertion, peripheral edema,  prolonged cough, abdominal pain, hematochezia, muscle weakness, difficulty walking, depression, and enlarged lymph nodes.    Physical Exam  General:  WNWD white female in no distress Head:  normocephalic and atraumatic.   Eyes:  vision grossly intact and pupils round.   Neck:  supple and full ROM.   Lungs:  normal respiratory effort, normal breath sounds, no crackles, and no wheezes.   Heart:  normal rate, regular rhythm, and no murmur.   Abdomen:  soft.   Pulses:  2+ Extremities:  1+ edema distal lower extremities.  Neurologic:  alert & oriented X3, cranial nerves II-XII intact, strength normal in all extremities, and DTRs symmetrical and normal.   Skin:  turgor normal, color normal, no rashes, and no purpura.   Cervical Nodes:  no anterior cervical adenopathy and no posterior cervical adenopathy.   Psych:  Oriented X3, memory intact for recent and remote, normally interactive, and good eye contact.     Impression & Recommendations:  Problem # 1:  NEURALGIA, TRIGEMINAL (ICD-350.1) Resolved and doing well off carbamazipine  Problem # 2:  HYPERTENSION (ICD-401.9) Detailed review of medications. Final list as now on MAR. Suspect peripheral edema may be due to high dose amlodipine.  Plan - adhere to present list of meds. If peripheral edema doesn't resolve in 2 weeks will need to reassess.           with change in medication patient is to monitor her blood pressure for possible rise.  Her updated medication list for this problem includes:    Furosemide 40 Mg Tabs (Furosemide) .Marland Kitchen... Take one tablet daily    Amlodipine Besylate 5 Mg Tabs (Amlodipine besylate) .Marland Kitchen... 1 by mouth once daily    Diovan 160 Mg Tabs (Valsartan) .Marland Kitchen... 1 daily  Problem # 3:  DIABETES MELLITUS (ICD-250.00) Last A1C Jan '11 was 7.6% - no change was indicated in her present regimen. she understands the need to avoid hypoglycemia. She reports home CBGs are good.   Plan - continue present regiemen.  Her updated  medication list for this problem includes:    Humulin 70/30 70-30 % Susp (Insulin isophane & regular) .Marland Kitchen... Take 35 units am 20 units pm    Aspirin Low Dose 81 Mg Tbec (Aspirin) .Marland Kitchen... Three aspirins a day    Diovan 160 Mg Tabs (Valsartan) .Marland Kitchen... 1 daily  Complete Medication List: 1)  Synthroid 100 Mcg Tabs (Levothyroxine sodium) .... Take one tablet daily 2)  Humulin 70/30 70-30 % Susp (Insulin isophane & regular) .... Take 35 units am 20 units pm 3)  Furosemide 40 Mg Tabs (Furosemide) .... Take one tablet daily 4)  Advair Diskus 250-50 Mcg/dose Misc (Fluticasone-salmeterol) .... Take one puff daily 5)  Aspirin Low Dose 81 Mg Tbec (Aspirin) .... Three aspirins a day 6)  Oscal 500/200 D-3 500-200 Mg-unit Tabs (Calcium-vitamin d) .... Take one tablet once daily 7)  Multivitamins Tabs (Multiple  vitamin) .... Take one tablet once daily 8)  Vitamin E 400 Unit Caps (Vitamin e) .... Take one tablet once daily 9)  Amitriptyline Hcl 25 Mg Tabs (Amitriptyline hcl) .... Take 1-2 tablets at bedtime as needed 10)  Biotin 1000 Mcg Tabs (Biotin) .Marland Kitchen.. 1 once daily 11)  Bd Insulin Syringe Ultrafine 30g X 1/2" 0.5 Ml Misc (Insulin syringe-needle u-100) .... Use as directed 12)  Onetouch Ultra Test Strp (Glucose blood) .... Test blood glucose levels two times a day 13)  Omega-3 1000 Mg Caps (Omega-3 fatty acids) .... Take 1 tablet by mouth once a day 14)  Amlodipine Besylate 5 Mg Tabs (Amlodipine besylate) .Marland Kitchen.. 1 by mouth once daily 15)  Simvastatin 40 Mg Tabs (Simvastatin) .Marland Kitchen.. 1 daily 16)  Diovan 160 Mg Tabs (Valsartan) .Marland Kitchen.. 1 daily  Patient Instructions: 1)  Reviewed all medications and eliminated duplicates and clearified med list. Please take medications as listed below. 2)  Leg swelling - may  be due to medications, especially the high dose (that was incorrect) of amlodipine. If the swelling continues after two weeks on the correct doses of medications let me know.

## 2010-05-15 ENCOUNTER — Other Ambulatory Visit: Payer: Self-pay | Admitting: Internal Medicine

## 2010-05-23 ENCOUNTER — Other Ambulatory Visit: Payer: Self-pay | Admitting: Internal Medicine

## 2010-05-27 NOTE — Assessment & Plan Note (Signed)
[facility]                           PRIMARY CARE OFFICE NOTE   [name], [name]                      MRN:          [id number]  DATE:07/14/2006                            DOB:          04-20-1917    [name] is an 77 year old Caucasian woman followed for multiple  medical problems including diabetes, COPD, hypothyroid disease and  hypertension.  She was seen in the office recently and had lab work June 25, 2006.  This revealed a serum glucose of 266 and an A1C at 8.5%.  The  patient is here to discuss her labs.  She is also here for mole removal.   The patient reports her blood sugars have been well controlled; however,  she presented documentation of her a.m. before breakfast blood sugars  running in the 90 range.  Afternoon and evening blood sugars were in the  110 to 140 range.  She reports that she has been more consciences with  her diet.  She is concerned about a hypoglycemic episodes if she does  not have a snack at bedtime.   CURRENT MEDICATIONS:  1. Synthroid 100 mcg daily.  2. Humulin 70/30, 35 units q. a.m., 20 units q. p.m.  3. Vytorin 10/40.  4. Atacand 32 mg daily.  5. Lasix 40 mg daily.  6. Advair 250/50 one inhalation b.i.d.  7. Aspirin 81 mg daily.  8. Os-Cal with D.  9. Multivitamins.  10.Vitamin E.  11.Selenium.  12.Amitriptyline 25 to 50 mg nightly.   VITAL SIGNS:  Were stable with temperature of 97.1, blood pressure  138/78, pulse was 100, weight 140.  GENERAL APPEARANCE:  A well-nourished, well-developed elderly woman in  no acute distress.   PROCEDURE:  Removal of mole by shave biopsy of the left __________.  The  patient had a changing mole that was erythematous in nature with a well  __________border with significant veining.  Concern for possible basal  cell carcinoma.  The area was prepped with alcohol, anesthetized with  Xylocaine with epinephrine, re-fed with Betadine.  Shave biopsy was  performed  with the specimen sent to path lab.  Wound was subsequently  cauterized with a __________setting of 10. The patient tolerated this  procedure well.   PROCEDURE 2:  The patient has 2 moles that appear benign on her right  neck which have been irritated by excoriation and by clothing.  Each  wound was prepped with alcohol, anesthetized with Xylocaine with  epinephrine, prepped with Betadine and shaved biopsies were done to  remove these lesions.  Resulting wounds were cauterized with a _________  setting of 10.  Specimens were not sent off to lab.   ASSESSMENT AND PLAN:  1. Diabetes.  Patient's blood sugar by her report seem to be much      better controlled than her last hemoglobin A1C would indicate.  She      does admit to a better dietary management.  Plan:  Continue present      regimen.  She will have a repeat hemoglobin A1C in 3 months and  make additional adjustments at that time as needed.  2. Derm.  Patient with suspicious mole removed from her left cheek.      She will be notified of path report when available.  She was given      routine wound precautions.     Rosalyn Gess Norins, MD  Electronically Signed    MEN/MedQ  DD: 07/14/2006  DT: 07/15/2006  Job #: [id number]

## 2010-05-30 NOTE — Assessment & Plan Note (Signed)
[facility]                             PRIMARY CARE OFFICE NOTE   [name], [name]                      MRN:          [id number]  DATE:11/30/2005                            DOB:          [birth year]    [name] is an 77 year old woman who presents with a 2-week history of  intermittent upper respiratory infection symptoms that have waxed and waned.  Since Saturday, she is having increasing problems with cough, wheezing,  shortness of breath, increased work of breathing.  She reports that her  cough is only intermittently productive.  Of note, the patient was last  treated for asthmatic bronchitis on May 12, 2005.   PAST SURGICAL HISTORY:  1. Thyroidectomy, remote.  2. TAH.  3. Cataract surgery.  4. Polypectomy.   PAST MEDICAL HISTORY:  1. IDDM.  2. COPD.  3. Hypothyroid disease.  4. DVT in 1998.  5. Hypertension.   CURRENT MEDICATIONS:  1. Synthroid 100 mcg daily.  2. Humulin N 35 units q.a.m., 20 units q.p.m.  3. Vytorin 10/40 q.h.s.  4. Atacand 32 mg daily.  5. Furosemide 40 mg daily.  6. Advair 250/50 one inhalation q.a.m. and q.h.s.  7. Aspirin 81 mg b.i.d.  8. Os-Cal 500 with D daily.  9. Multivitamin daily.  10.Vitamin E daily.  11.Selenium 50 mcg daily.   REVIEW OF SYSTEMS:  The patient has had no rigors or chills.  She does not  report any documented fever.  She has had no cardiovascular complaints.  No  GI problems.  No GU problems.   PHYSICAL EXAMINATION:  VITAL SIGNS:  Temperature 98, blood pressure 116/68,  pulse 99, weight 140, O2 saturation on room air 95%.  GENERAL:  This is a pleasant woman looking younger than her stated  chronologic age of 60 who is obviously uncomfortable, with increased work of  breathing and cough.  HEENT:  Unremarkable.  CHEST:  The patient has mildly decreased breath sounds at the right base.  There was mild egophony at the right base.  She had normal fremitus.  The  patient has  diffuse wheezing throughout but worse with expiration.  CARDIOVASCULAR:  2+ radial pulses.  Her precordium is quiet.  She had a  regular rate and rhythm.  She is mildly tachycardic.  No further exam  conducted.   ASSESSMENT AND PLAN:  1. Pulmonary.  Patient with asthmatic chronic obstructive pulmonary      disease now with an exacerbation.  She has no fever.  She has no      purulent sputum.  Chest x-ray was performed which revealed a question      of increased haziness at the right base near where she has positive      egophony.  I would be concerned for already developing pneumonia versus      atypical asthmatic bronchitis.  I discussed the treatment options with      the patient, and she prefers an outpatient trial.   PLAN:  The patient will be started on Avelox 400 mg daily.  Azithromycin 500  mg p.o. daily.  She is to use DuoNeb, samples provided, with her hand-held  nebulizer treatments three times a day.  She is to continue with Advair.  At  this point, we will avoid oral steroids since she is already on the Advair  with the fluticasone.  The patient will need to be seen in followup on  Tuesday, December 01, 2005.  She understands and has a low threshold for  inpatient treatment and she does not respond quickly to outpatient therapy.   ADDENDUM  In talking with the patient, she does not have a nebulizer at home but has  been using in the past an Aerochamber.  Will change my treatment plan above  to albuterol treatments with Aerochamber two inhalations three times a day  and to continue with Advair.     Rosalyn Gess Norins, MD  Electronically Signed    MEN/MedQ  DD: 11/30/2005  DT: 11/30/2005  Job #: [id number]

## 2010-05-30 NOTE — Discharge Summary (Signed)
NAME:  [name], [name]                         ACCOUNT NO.:  [id number]   MEDICAL RECORD NO.:  [id number]                   PATIENT TYPE:  INP   LOCATION:  0447                                 FACILITY:  [facility]   PHYSICIAN:  Rosalyn Gess. Norins, M.D. [facility]         DATE OF BIRTH:  09-29-1917   DATE OF ADMISSION:  03/13/2002  DATE OF DISCHARGE:  03/16/2002                                 DISCHARGE SUMMARY   ADMISSION DIAGNOSES:  1. Chronic obstructive pulmonary disease with bronchitic exacerbation.  2. Diabetes.  3. History of tachycardia and hypertension.   DISCHARGE DIAGNOSES:  1. Chronic obstructive pulmonary disease with bronchitic exacerbation.  2. Diabetes.  3. History of tachycardia and hypertension.   CONSULTATIONS:  None.   PROCEDURES:  Chest x-ray on admission which revealed increased bibasilar  atelectasis versus infiltrate versus scarring at the bases.   HISTORY OF PRESENT ILLNESS:  The patient is an 77 year old white widowed  female with a history of chronic obstructive pulmonary disease who reported  a two week illness with progressive shortness of breath and wheezing.  She  presented to the office on the day of admission with significant tachypnea,  tachycardia, and an O2 saturation of 90% on room air.  She was also  diaphoretic and using accessory muscles of respiration.   PAST SURGICAL HISTORY:  1. Thyroidectomy.  2. Hysterectomy.   PAST MEDICAL HISTORY:  1. The patient has had multiple episodes of pneumonia and pleurisy.  2. Chronic obstructive pulmonary disease with an asthmatic component.  3. Insulin-dependent diabetes mellitus since 1987.  4. Hypothyroid disease since thyroidectomy.  5. History of hyperlipidemia.  6. History of adenomatous colon polyps with last colonoscopy in 5/00.  7. Diverticulosis.   ADMISSION MEDICATIONS:  1. Synthroid 100 mcg daily.  2. Lipitor 20 mg daily.  3. Lasix 40 mg daily.  4. Atacand 32 mg daily.  5. Advair 250/50  b.i.d.  6. Humulin N 35 units q.a.m. and 18 units q.p.m.  7. Aspirin 81 mg daily.  8. Evista 60 mg daily.  9. Actos 30 mg daily.   FAMILY HISTORY:  Noncontributory.   SOCIAL HISTORY:  The patient is a widow.  She has three sons, one daughter,  10 grandchildren, three great-grandchildren.  She is retired.  She has been  living alone, although her son recently has been living with her.   PHYSICAL EXAMINATION:  VITAL SIGNS:  Temperature 97.1, blood pressure  140/66, heart rate 125, O2 saturation 90%.  GENERAL:  This was a well-  nourished woman with increased work of breathing using accessory muscles of  respiration.  HEENT:  Unremarkable.  NECK:  Supple, no nodes were  appreciated.  CHEST:  The patient had coarse rhonchi throughout with shallow  inspirations.  She had end expiratory wheezing with prolonged expiratory  phase.  CARDIOVASCULAR:  Regular tachycardia without murmurs, rubs, or  gallops.  ABDOMEN:  Soft with positive bowel sounds.  HOSPITAL COURSE:  The patient was admitted to the hospital.  She was  initially started on Rocephin 1 g IV, and azithromycin 500 mg p.o. daily.  She was started on Solu-Medrol 80 mg IV q.12h., oxygen at 2 L per nasal  cannula.  The patient was given Atrovent and albuterol nebulizers q.4h.   The patient had a rapid response to therapy.  Her laboratory returned  showing a normal white blood cell count with a normal differential.  Chest x-  ray without infiltrate.  The patient was taken off of Rocephin and continued  on azithromycin.  Solu-Medrol was tapered to 40 mg IV q.12h.  The patient  continued to do well with improved respiratory function with decreased  wheezing.   On the day of discharge, the patient was breathing without difficulty,  sitting up in a chair, off oxygen, with adequate O2 saturations.  Examination revealed good breath sounds without wheezes, rhonchi, or rales.  The patient was felt to be stable for discharge home on a  prednisone taper,  and to continue her other home medications.   #2 -  DIABETES:  The patient did have a hemoglobin A1C that was mildly  elevated at 7.5%.  This is below goal.  We will adjust the patient's  medications as an outpatient.  #3 -  CARDIOVASCULAR:  The patient's tachycardia improved.   DISCHARGE PHYSICAL EXAMINATION:  VITAL SIGNS:  Temperature 97.8, blood  pressure was 162/64, heart rate 105, respirations 20, O2 saturation was 96%  on room air.  CBG on 03/15/02, was 265, 281, and 132.  GENERAL:  This is a  well-nourished woman sitting in a chair who has no increased work of  breathing and no respiratory distress.  CHEST:  The patient is moving air  well.  No wheezes, rhonchi, or rales were appreciated.  CARDIOVASCULAR:  Radial pulses were 2+.  She had a regular tachycardia.   FINAL LABORATORY DATA:  Sputum Gram smear revealed a few gram negative rods,  rare gram positive rods, rare yeast.  The patient's only CBC on 03/13/02, with  a white blood cell count of 10,400, 74% segs, 18% lymphs, 7% monos.  Hemoglobin 14.1, hematocrit 41.3.  Chemistries were normal with a sodium of  138, potassium 4.3, glucose 111, BUN 24, creatinine 1.  Liver function tests  were normal.  Hemoglobin A1C 7.5%.  The patient's chest x-ray as noted.   DISPOSITION:  The patient is discharged home to follow up with Dr. Debby Bud in  7 to 10 days.  Of note, the patient will need to have follow up with  dermatology for what appears to be a keratotic lesion behind the left ear.    DISCHARGE MEDICATIONS:  1. The patient will resume all of her home medications, including Synthroid,     Lipitor 20 mg, Lasix 40 mg, Atacand 32 mg, Advair 250/50 b.i.d., Evista     60 mg, Actos 30 mg, aspirin 81 mg daily, and Humulin N 35 units q.a.m.     and 18 units q.p.m.  2. The patient will also be on a prednisone taper at 20 mg b.i.d. x3 days,    30 mg daily x3 days, 20 mg daily x3 days, 10 mg daily x6 days, then off.    CONDITION ON DISCHARGE:  Stable and improved.  Rosalyn Gess Norins, M.D. [facility]    MEN/MEDQ  D:  03/16/2002  T:  03/16/2002  Job:  [id number]

## 2010-05-30 NOTE — Assessment & Plan Note (Signed)
[facility]                                 ON-CALL NOTE   [name], [name]                      MRN:          [id number]  DATE:09/25/2006                            DOB:          04/12/1917    Saturday Morning on the thirteenth on Itzamar Hindley, Dr. Debby Bud'  patient.  Phone number [phone].  The patient is diabetic and is on  insulin, Humulin 70/30 35 units in the morning and 20 in the p.m.  She  called earlier in the week, apparently her medicine was not refilled, so  we called it in to [facility] in [place].  She should get her further refills  when the office is open.     Neta Mends. Panosh, MD  Electronically Signed    WKP/MedQ  DD: 09/25/2006  DT: 09/26/2006  Job #: [id number]

## 2010-07-07 ENCOUNTER — Other Ambulatory Visit: Payer: Self-pay | Admitting: Internal Medicine

## 2010-07-28 ENCOUNTER — Other Ambulatory Visit: Payer: Self-pay | Admitting: Internal Medicine

## 2010-08-14 ENCOUNTER — Other Ambulatory Visit: Payer: Self-pay | Admitting: Internal Medicine

## 2010-09-04 ENCOUNTER — Other Ambulatory Visit: Payer: Self-pay | Admitting: Internal Medicine

## 2010-09-23 ENCOUNTER — Other Ambulatory Visit: Payer: Self-pay | Admitting: Internal Medicine

## 2010-10-14 ENCOUNTER — Other Ambulatory Visit: Payer: Self-pay | Admitting: Internal Medicine

## 2010-11-03 ENCOUNTER — Other Ambulatory Visit: Payer: Self-pay | Admitting: Internal Medicine

## 2010-11-19 ENCOUNTER — Other Ambulatory Visit: Payer: Self-pay | Admitting: Internal Medicine

## 2010-11-22 ENCOUNTER — Other Ambulatory Visit: Payer: Self-pay | Admitting: Internal Medicine

## 2010-12-11 ENCOUNTER — Other Ambulatory Visit: Payer: Self-pay | Admitting: Internal Medicine

## 2010-12-13 ENCOUNTER — Other Ambulatory Visit: Payer: Self-pay | Admitting: Internal Medicine

## 2010-12-30 ENCOUNTER — Other Ambulatory Visit: Payer: Self-pay | Admitting: Internal Medicine

## 2010-12-31 ENCOUNTER — Other Ambulatory Visit: Payer: Self-pay | Admitting: Internal Medicine

## 2011-01-08 ENCOUNTER — Encounter: Payer: Self-pay | Admitting: Internal Medicine

## 2011-01-12 ENCOUNTER — Other Ambulatory Visit: Payer: Self-pay | Admitting: Internal Medicine

## 2011-01-12 DIAGNOSIS — Z1231 Encounter for screening mammogram for malignant neoplasm of breast: Secondary | ICD-10-CM

## 2011-01-14 ENCOUNTER — Other Ambulatory Visit (INDEPENDENT_AMBULATORY_CARE_PROVIDER_SITE_OTHER): Payer: Medicare Other

## 2011-01-14 ENCOUNTER — Ambulatory Visit (INDEPENDENT_AMBULATORY_CARE_PROVIDER_SITE_OTHER): Payer: Medicare Other | Admitting: Internal Medicine

## 2011-01-14 ENCOUNTER — Encounter: Payer: Self-pay | Admitting: Internal Medicine

## 2011-01-14 VITALS — BP 124/84 | HR 102 | Temp 97.1°F | Wt 137.0 lb

## 2011-01-14 DIAGNOSIS — J449 Chronic obstructive pulmonary disease, unspecified: Secondary | ICD-10-CM

## 2011-01-14 DIAGNOSIS — I10 Essential (primary) hypertension: Secondary | ICD-10-CM

## 2011-01-14 DIAGNOSIS — E119 Type 2 diabetes mellitus without complications: Secondary | ICD-10-CM

## 2011-01-14 DIAGNOSIS — Z23 Encounter for immunization: Secondary | ICD-10-CM

## 2011-01-14 DIAGNOSIS — E039 Hypothyroidism, unspecified: Secondary | ICD-10-CM

## 2011-01-14 DIAGNOSIS — J4489 Other specified chronic obstructive pulmonary disease: Secondary | ICD-10-CM

## 2011-01-14 DIAGNOSIS — R222 Localized swelling, mass and lump, trunk: Secondary | ICD-10-CM

## 2011-01-14 DIAGNOSIS — E785 Hyperlipidemia, unspecified: Secondary | ICD-10-CM

## 2011-01-14 DIAGNOSIS — Z0000 Encounter for general adult medical examination without abnormal findings: Secondary | ICD-10-CM

## 2011-01-14 DIAGNOSIS — R1900 Intra-abdominal and pelvic swelling, mass and lump, unspecified site: Secondary | ICD-10-CM

## 2011-01-14 LAB — COMPREHENSIVE METABOLIC PANEL
ALT: 24 U/L (ref 0–35)
Albumin: 4.3 g/dL (ref 3.5–5.2)
CO2: 29 mEq/L (ref 19–32)
GFR: 43.41 mL/min — ABNORMAL LOW (ref >60.00)
Glucose, Bld: 248 mg/dL — ABNORMAL HIGH (ref 70–99)
Potassium: 4.8 mEq/L (ref 3.5–5.1)
Sodium: 139 mEq/L (ref 135–145)
Total Protein: 7.5 g/dL (ref 6.0–8.3)

## 2011-01-14 LAB — HEMOGLOBIN A1C: Hgb A1c MFr Bld: 8.6 % — ABNORMAL HIGH (ref 4.6–6.5)

## 2011-01-14 NOTE — Patient Instructions (Signed)
Diabetes- will check lab today and let you know if there is a need to change your regimen.  Left leg pain - the knee seems OK. The tenderness is at the place where the tendons attach to the bone for the quadriceps muscles.  Plan - rub of choice, i.e. Aspercreme, heat will help and an anti-inflammatory medication like Aleve (watch for stomach irritation.)  Lump lower abdomen - concern for hernia. Plan - CT of the abdominal wall to look for hernia.  General labs ordered today.  Review list below to be sure we are all on the same page.

## 2011-01-14 NOTE — Progress Notes (Signed)
Subjective:    Patient ID: [name], female    DOB: Nov 24, 1917, 78 y.o.   MRN: [id number]  HPI   The patient is here for annual Medicare wellness examination and management of other chronic and acute problems.  [name] reports that for the last several months she has had left leg pain: it started at the ankle and has now spread to the left knee and the suprapatellar area. She can bear weight but can have trouble initially moving her leg, she does have paresthesia of the left knee, no frank muscle weakness or wasting. She denies any back pain.   She reports that she does have some paresthesia of the hands and has been dropping objects.  She has found a lump in the lower abdomen. It is not painful except when manipulated.    The risk factors are reflected in the social history.  The roster of all physicians providing medical care to patient - is listed in the Snapshot section of the chart.  Activities of daily living:  The patient is 100% inedpendent in all ADLs: dressing, toileting, feeding as well as independent mobility  Home safety : The patient has smoke detectors in the home. They wear seatbelts.  firearms are present in the home, kept in a safe fashion. There is no violence in the home. She has not had any falls. She has had some near falls due to problems with her balance and due to left leg weakness. She does use a 4 prong cane.  There is no risks for hepatitis, STDs or HIV. There is no   history of blood transfusion. They have no travel history to infectious disease endemic areas of the world.  The patient has not seen their dentist in the last six month she has full dentures. They have not seen their eye doctor in the last year. They admit to any hearing difficulty but wears hearing aids. She has not had audiologic testing in the last year.  They do not  have excessive sun exposure. Discussed the need for sun protection: hats, long sleeves and use of sunscreen if there is  significant sun exposure.   Diet: the importance of a healthy diet is discussed. They do have a healthy diet.  The patient does not have a regular exercise program.  The benefits of regular aerobic exercise were discussed.  Depression screen: there are no signs or vegative symptoms of depression- irritability, change in appetite, anhedonia, sadness/tearfullness.  Cognitive assessment: the patient manages all their financial and personal affairs and is actively engaged. They could relate day,date,year and events; recalled 2/3 objects at 3 minutes; performed clock-face test normally.  The following portions of the patient's history were reviewed and updated as appropriate: allergies, current medications, past family history, past medical history,  past surgical history, past social history  and problem list.  Vision, hearing, body mass index were assessed and reviewed.   During the course of the visit the patient was educated and counseled about appropriate screening and preventive services including : fall prevention , diabetes screening, nutrition counseling, colorectal cancer screening, and recommended immunizations.  Past Medical History  Diagnosis Date  . CATARACT EXTRACTION, HX OF 09/21/2006  . COLONIC POLYPS, HX OF 09/21/2006  . COPD 09/21/2006  . DIABETES MELLITUS 09/21/2006  . DVT 09/21/2006  . Headache 06/13/2007  . HYPERLIPIDEMIA 09/21/2006  . HYPERTENSION 09/21/2006  . HYPOTHYROIDISM 09/21/2006  . HYSTERECTOMY, HX OF 09/21/2006  . NEURALGIA, TRIGEMINAL 10/01/2008  . OSTEOPOROSIS 09/21/2006  .  PLANTAR FASCIITIS 09/21/2006  . PRIMARY LOCALIZED OSTEOARTHROSIS HAND 06/13/2007  . THYROIDECTOMY, HX OF 09/21/2006   Past Surgical History  Procedure Date  . Abdominal hysterectomy   . Cataract extraction   . Thyroidectomy    No family history on file. History   Social History  . Marital Status: Widowed    Spouse Name: N/A    Number of Children: N/A  . Years of Education: N/A   Occupational  History  . Not on file.   Social History Main Topics  . Smoking status: Not on file  . Smokeless tobacco: Not on file  . Alcohol Use:   . Drug Use:   . Sexually Active:    Other Topics Concern  . Not on file   Social History Narrative   WidowedLives alone, very independent, continues to garden, does all her own housework, avid readerEnd of life care (01/28/09) in a discussion about the recent death sisiter she is very clear that she does not want heroic measures; no CPR, DNI, no other heroic measures. Will send a packet with out of facility order, "[employer] Guide" most form      Review of Systems Constitutional:  Negative for fever, chills, activity change and unexpected weight change.  HEENT:  Negative for hearing loss, ear pain, congestion, neck stiffness and postnasal drip. Negative for sore throat or swallowing problems. Negative for dental complaints.   Eyes: Negative for vision loss or change in visual acuity.  Respiratory: Negative for chest tightness and wheezing. Negative for DOE.   Cardiovascular: Negative for chest pain or palpitations. No decreased exercise tolerance Gastrointestinal: No change in bowel habit. No bloating or gas. No reflux or indigestion Genitourinary: Negative for urgency, frequency, flank pain and difficulty urinating.  Musculoskeletal: Negative for myalgias, back pain, arthralgias and gait problem.  Neurological: Negative for dizziness, tremors, weakness and headaches.  Hematological: Negative for adenopathy.  Psychiatric/Behavioral: Negative for behavioral problems and dysphoric mood.       Objective:   Physical Exam Vitals reviewed - normal Gen'l - WNWD white woman looking younger than her stated age. HEENT- C&S clear, EACs/TMs normal; oropharynx with dentures, no buccal lesions, throat clear Neck - supple, w/o thyromegaly Nodes - negative for enlarged lymph nodes Chest - no deformity Pulm - normal respirations, no rales or wheezes Cor -  2+ radial pulse, RRR Abd- soft Ext- no deformity or swelling of the small joints Neuro - A^O x 3, CN II-XII intact, MS normal, normal gait. Skin - clear  Lab Results  Component Value Date   GLUCOSE 248* 01/14/2011   CHOL 169 12/26/2009   TRIG 218.0* 12/26/2009   HDL 41.30 12/26/2009   LDLDIRECT 92.1 12/26/2009   LDLCALC 70 01/28/2009   ALT 24 01/14/2011   AST 27 01/14/2011   NA 139 01/14/2011   K 4.8 01/14/2011   CL 103 01/14/2011   CREATININE 1.2 01/14/2011   BUN 25* 01/14/2011   CO2 29 01/14/2011   TSH 0.35 01/14/2011   HGBA1C 8.6* 01/14/2011   MICROALBUR 0.2 06/25/2006          Assessment & Plan:

## 2011-01-15 DIAGNOSIS — Z0000 Encounter for general adult medical examination without abnormal findings: Secondary | ICD-10-CM | POA: Insufficient documentation

## 2011-01-15 NOTE — Assessment & Plan Note (Signed)
Interval medical history is unremarkable. Physical exam is good for a gal her age. Lab results are also good. He does not need repeat colorectal cancer screening. A mammogram may be appropriate. Immunizations for tetanus, pnuemonia up to date; no record of shingles vaccine in chart.   Reviewed and reconciled medication list - copy provided to patient and her son.  In summary - a very nice woman who appears medically stable. She will reutrn as needed.

## 2011-01-15 NOTE — Assessment & Plan Note (Signed)
Discussed the relative value of continued treatment in a 78 y/o who is stable.  Plan - will d/c "statin" therapy

## 2011-01-15 NOTE — Assessment & Plan Note (Signed)
Patient is adherent to her treatment regimen although she does make some adjustments on the fly. Her A1c @ 8.6 is too high.  Plan - no change in regimen.             Continue to adhere to a no sugar, low carb diet.

## 2011-01-15 NOTE — Assessment & Plan Note (Signed)
BP Readings from Last 3 Encounters:  01/14/11 124/84  12/26/09 190/64  05/27/09 126/82   Generally good control.  Plan - continue present medications

## 2011-01-15 NOTE — Assessment & Plan Note (Signed)
Lab Results  Component Value Date   TSH 0.35 01/14/2011   Good control. Continue present dose of replacement thyroid.

## 2011-01-15 NOTE — Assessment & Plan Note (Signed)
Very stable with no respiratory discomfort or limitations

## 2011-01-16 ENCOUNTER — Ambulatory Visit (INDEPENDENT_AMBULATORY_CARE_PROVIDER_SITE_OTHER)
Admission: RE | Admit: 2011-01-16 | Discharge: 2011-01-16 | Disposition: A | Discharge status: Home / Self Care | Payer: Medicare Other | Source: Ambulatory Visit | Attending: Internal Medicine | Admitting: Internal Medicine

## 2011-01-16 DIAGNOSIS — R222 Localized swelling, mass and lump, trunk: Secondary | ICD-10-CM

## 2011-01-16 DIAGNOSIS — R1900 Intra-abdominal and pelvic swelling, mass and lump, unspecified site: Secondary | ICD-10-CM

## 2011-01-19 ENCOUNTER — Telehealth: Payer: Self-pay | Admitting: *Deleted

## 2011-01-19 ENCOUNTER — Encounter: Payer: Self-pay | Admitting: Internal Medicine

## 2011-01-19 MED ORDER — INSULIN NPH ISOPHANE & REGULAR (70-30) 100 UNIT/ML ~~LOC~~ SUSP: SUBCUTANEOUS | Status: DC

## 2011-01-19 MED ORDER — VALSARTAN 160 MG PO TABS: 160.0000 mg | ORAL_TABLET | Freq: Every day | ORAL | Status: DC

## 2011-01-19 MED ORDER — AMITRIPTYLINE HCL 25 MG PO TABS: 25.0000 mg | ORAL_TABLET | Freq: Every day | ORAL | Status: DC

## 2011-01-19 MED ORDER — FLUTICASONE-SALMETEROL 250-50 MCG/DOSE IN AEPB: 1.0000 | INHALATION_SPRAY | Freq: Two times a day (BID) | RESPIRATORY_TRACT | Status: DC

## 2011-01-19 MED ORDER — FUROSEMIDE 40 MG PO TABS: 40.0000 mg | ORAL_TABLET | Freq: Every day | ORAL | Status: DC

## 2011-01-19 MED ORDER — LEVOTHYROXINE SODIUM 100 MCG PO TABS: 100.0000 ug | ORAL_TABLET | Freq: Every day | ORAL | Status: DC

## 2011-01-19 MED ORDER — GLUCOSE BLOOD VI STRP: ORAL_STRIP | Status: DC

## 2011-01-19 MED ORDER — AMLODIPINE BESYLATE 5 MG PO TABS: 5.0000 mg | ORAL_TABLET | Freq: Every day | ORAL | Status: DC

## 2011-01-19 MED ORDER — "INSULIN SYRINGE-NEEDLE U-100 31G X 5/16"" 0.5 ML MISC": Status: DC

## 2011-01-19 NOTE — Telephone Encounter (Signed)
Pt states saw md on last Wednesday rx's was suppose to be sent to pharmacy. Pt states pharmacy hasn't received any refills and she is out of insulin. Inform pt will send meds to [facility]/ [address]...01/19/11@10 :37am/LMB

## 2011-01-20 ENCOUNTER — Telehealth: Payer: Self-pay | Admitting: *Deleted

## 2011-01-20 NOTE — Telephone Encounter (Signed)
Attempted to contact patient via phone/Busy.

## 2011-01-20 NOTE — Telephone Encounter (Signed)
Message copied by Regis Bill on Tue Jan 20, 2011 12:46 PM ------      Message from: Illene Regulus E      Created: Mon Jan 19, 2011  5:41 AM       Please call patient: small abdominal wall hernia with no evidence of intestine involvement of strangulation. Will continue to observe. Call if the hernia is enlarging or more painful.

## 2011-01-27 ENCOUNTER — Telehealth: Payer: Self-pay | Admitting: *Deleted

## 2011-01-27 NOTE — Telephone Encounter (Signed)
PT IS REQUESTING TEST RESULTS

## 2011-02-09 ENCOUNTER — Ambulatory Visit
Admission: RE | Admit: 2011-02-09 | Discharge: 2011-02-09 | Disposition: A | Discharge status: Home / Self Care | Payer: Medicare Other | Source: Ambulatory Visit | Attending: Internal Medicine | Admitting: Internal Medicine

## 2011-02-09 DIAGNOSIS — Z1231 Encounter for screening mammogram for malignant neoplasm of breast: Secondary | ICD-10-CM

## 2011-02-20 ENCOUNTER — Telehealth: Payer: Self-pay

## 2011-02-20 NOTE — Telephone Encounter (Signed)
Pt called requesting result of CT scan done in January, pt says she was never advised of results. Pt given results

## 2011-03-16 ENCOUNTER — Ambulatory Visit: Payer: Medicare Other | Admitting: Internal Medicine

## 2011-03-16 ENCOUNTER — Emergency Department (HOSPITAL_COMMUNITY)
Admission: EM | Admit: 2011-03-16 | Discharge: 2011-03-16 | Disposition: A | Discharge status: Home / Self Care | Payer: Medicare Other | Attending: Emergency Medicine | Admitting: Emergency Medicine

## 2011-03-16 ENCOUNTER — Emergency Department (HOSPITAL_COMMUNITY): Payer: Medicare Other

## 2011-03-16 ENCOUNTER — Telehealth: Payer: Self-pay

## 2011-03-16 ENCOUNTER — Encounter (HOSPITAL_COMMUNITY): Payer: Self-pay | Admitting: Emergency Medicine

## 2011-03-16 ENCOUNTER — Telehealth: Payer: Self-pay | Admitting: Internal Medicine

## 2011-03-16 DIAGNOSIS — E039 Hypothyroidism, unspecified: Secondary | ICD-10-CM | POA: Insufficient documentation

## 2011-03-16 DIAGNOSIS — R141 Gas pain: Secondary | ICD-10-CM | POA: Insufficient documentation

## 2011-03-16 DIAGNOSIS — R142 Eructation: Secondary | ICD-10-CM | POA: Insufficient documentation

## 2011-03-16 DIAGNOSIS — R5381 Other malaise: Secondary | ICD-10-CM | POA: Insufficient documentation

## 2011-03-16 DIAGNOSIS — R197 Diarrhea, unspecified: Secondary | ICD-10-CM | POA: Insufficient documentation

## 2011-03-16 DIAGNOSIS — E119 Type 2 diabetes mellitus without complications: Secondary | ICD-10-CM | POA: Insufficient documentation

## 2011-03-16 DIAGNOSIS — I10 Essential (primary) hypertension: Secondary | ICD-10-CM | POA: Insufficient documentation

## 2011-03-16 DIAGNOSIS — Z794 Long term (current) use of insulin: Secondary | ICD-10-CM | POA: Insufficient documentation

## 2011-03-16 LAB — CBC
HCT: 45.5 % (ref 36.0–46.0)
Hemoglobin: 15.5 g/dL — ABNORMAL HIGH (ref 12.0–15.0)
MCHC: 34.1 g/dL (ref 30.0–36.0)
RBC: 5.41 MIL/uL — ABNORMAL HIGH (ref 3.87–5.11)

## 2011-03-16 LAB — BASIC METABOLIC PANEL
BUN: 15 mg/dL (ref 6–23)
CO2: 22 mEq/L (ref 19–32)
Chloride: 106 mEq/L (ref 96–112)
Creatinine, Ser: 1.17 mg/dL — ABNORMAL HIGH (ref 0.50–1.10)
GFR calc Af Amer: 45 mL/min — ABNORMAL LOW (ref >90)
Glucose, Bld: 64 mg/dL — ABNORMAL LOW (ref 70–99)
Potassium: 4 mEq/L (ref 3.5–5.1)

## 2011-03-16 LAB — DIFFERENTIAL
Basophils Relative: 0 % (ref 0–1)
Lymphocytes Relative: 27 % (ref 12–46)
Lymphs Abs: 2.4 10*3/uL (ref 0.7–4.0)
Monocytes Absolute: 0.7 10*3/uL (ref 0.1–1.0)
Monocytes Relative: 8 % (ref 3–12)
Neutro Abs: 5.7 10*3/uL (ref 1.7–7.7)
Neutrophils Relative %: 64 % (ref 43–77)

## 2011-03-16 MED ORDER — SODIUM CHLORIDE 0.9 % IV BOLUS (SEPSIS): 1000.0000 mL | Freq: Once | INTRAVENOUS | Status: DC

## 2011-03-16 NOTE — ED Notes (Signed)
NWG:[id number]<ZH> Expected date:03/16/11<BR> Expected time:12:08 PM<BR> Means of arrival:Ambulance<BR> Comments:<BR> M11. 78 yo f. Sick, weak, diarrhea since Thursday. 15 mins

## 2011-03-16 NOTE — Telephone Encounter (Signed)
The pt called and is hoping to be seen today for stomach pain.  She was offered an appt with [facility] at 2:30pm, but refused.  Please advise if she can be worked in.  Thanks!

## 2011-03-16 NOTE — ED Notes (Signed)
BP 192/74, Pulse 94, 02-97%, CBG 86

## 2011-03-16 NOTE — Discharge Instructions (Signed)
Diarrhea Infections caused by germs (bacterial) or a virus commonly cause diarrhea. Your caregiver has determined that with time, rest and fluids, the diarrhea should improve. In general, eat normally while drinking more water than usual. Although water may prevent dehydration, it does not contain salt and minerals (electrolytes). Broths, weak tea without caffeine and oral rehydration solutions (ORS) replace fluids and electrolytes. Small amounts of fluids should be taken frequently. Large amounts at one time may not be tolerated. Plain water may be harmful in infants and the elderly. Oral rehydrating solutions (ORS) are available at pharmacies and grocery stores. ORS replace water and important electrolytes in proper proportions. Sports drinks are not as effective as ORS and may be harmful due to sugars worsening diarrhea.  ORS is especially recommended for use in children with diarrhea. As a general guideline for children, replace any new fluid losses from diarrhea and/or vomiting with ORS as follows:   If your child weighs 22 pounds or under (10 kg or less), give 60-120 mL ( -  cup or 2 - 4 ounces) of ORS for each episode of diarrheal stool or vomiting episode.   If your child weighs more than 22 pounds (more than 10 kgs), give 120-240 mL ( - 1 cup or 4 - 8 ounces) of ORS for each diarrheal stool or episode of vomiting.   While correcting for dehydration, children should eat normally. However, foods high in sugar should be avoided because this may worsen diarrhea. Large amounts of carbonated soft drinks, juice, gelatin desserts and other highly sugared drinks should be avoided.   After correction of dehydration, other liquids that are appealing to the child may be added. Children should drink small amounts of fluids frequently and fluids should be increased as tolerated. Children should drink enough fluids to keep urine clear or pale yellow.   Adults should eat normally while drinking more fluids  than usual. Drink small amounts of fluids frequently and increase as tolerated. Drink enough fluids to keep urine clear or pale yellow. Broths, weak decaffeinated tea, lemon lime soft drinks (allowed to go flat) and ORS replace fluids and electrolytes.   Avoid:   Carbonated drinks.   Juice.   Extremely hot or cold fluids.   Caffeine drinks.   Fatty, greasy foods.   Alcohol.   Tobacco.   Too much intake of anything at one time.   Gelatin desserts.   Probiotics are active cultures of beneficial bacteria. They may lessen the amount and number of diarrheal stools in adults. Probiotics can be found in yogurt with active cultures and in supplements.   Wash hands well to avoid spreading bacteria and virus.   Anti-diarrheal medications are not recommended for infants and children.   Only take over-the-counter or prescription medicines for pain, discomfort or fever as directed by your caregiver. Do not give aspirin to children because it may cause Reye's Syndrome.   For adults, ask your caregiver if you should continue all prescribed and over-the-counter medicines.   If your caregiver has given you a follow-up appointment, it is very important to keep that appointment. Not keeping the appointment could result in a chronic or permanent injury, and disability. If there is any problem keeping the appointment, you must call back to this facility for assistance.  SEEK IMMEDIATE MEDICAL CARE IF:   You or your child is unable to keep fluids down or other symptoms or problems become worse in spite of treatment.   Vomiting or diarrhea develops and becomes persistent.     There is vomiting of blood or bile (green material).   There is blood in the stool or the stools are black and tarry.   There is no urine output in 6-8 hours or there is only a small amount of very dark urine.   Abdominal pain develops, increases or localizes.   You have a fever.   Your baby is older than 3 months with a  rectal temperature of 102 F (38.9 C) or higher.   Your baby is 3 months old or younger with a rectal temperature of 100.4 F (38 C) or higher.   You or your child develops excessive weakness, dizziness, fainting or extreme thirst.   You or your child develops a rash, stiff neck, severe headache or become irritable or sleepy and difficult to awaken.  MAKE SURE YOU:   Understand these instructions.   Will watch your condition.   Will get help right away if you are not doing well or get worse.  Document Released: 12/19/2001 Document Revised: 12/18/2010 Document Reviewed: 11/05/2008 [employer] Patient Information 2012 [employer]. 

## 2011-03-16 NOTE — Telephone Encounter (Signed)
The patient's son decided to take her to the emergency room.  He stated he appreciated being able to be worked in, but was extremely anxious and wants her to be seen in the emergency room.  Thanks!

## 2011-03-16 NOTE — Telephone Encounter (Signed)
OK for PM add on - she should call about 5 to see how far behind I am.

## 2011-03-16 NOTE — ED Provider Notes (Signed)
History     CSN: [id number]  Arrival date & time 03/16/11  1219   First MD Initiated Contact with Patient 03/16/11 1245      Chief Complaint  Patient presents with  . Diarrhea    (Consider location/radiation/quality/duration/timing/severity/associated sxs/prior treatment) HPI Comments: Patient presents with diarrhea since Thursday.  She notes that it is watery loose stools and she's not noted any blood or black stools.  She denies any abdominal pain or any nausea or vomiting.  Denies any fevers or any recent antibiotic usage in the last one to 2 months.  No specific sick contacts.  Patient comes in because the diarrhea has been persistent over the last several days.  She thought that the diarrhea might be slowing down over the last few hours as well.  She notes some generalized weakness related to this.  She has been able to tolerate some by mouth intake but feels that food goes right through her very quickly.  Patient is a 78 y.o. female presenting with diarrhea. The history is provided by the patient. No language interpreter was used.  Diarrhea The primary symptoms include fatigue and diarrhea. Primary symptoms do not include fever, weight loss, abdominal pain, nausea, vomiting, melena, hematemesis, jaundice, hematochezia, dysuria, myalgias, arthralgias or rash. The illness began 3 to 5 days ago. The onset was gradual.  The illness does not include chills or back pain.    Past Medical History  Diagnosis Date  . CATARACT EXTRACTION, HX OF 09/21/2006  . COLONIC POLYPS, HX OF 09/21/2006  . COPD 09/21/2006  . DIABETES MELLITUS 09/21/2006  . DVT 09/21/2006  . Headache 06/13/2007  . HYPERLIPIDEMIA 09/21/2006  . HYPERTENSION 09/21/2006  . HYPOTHYROIDISM 09/21/2006  . HYSTERECTOMY, HX OF 09/21/2006  . NEURALGIA, TRIGEMINAL 10/01/2008  . OSTEOPOROSIS 09/21/2006  . PLANTAR FASCIITIS 09/21/2006  . PRIMARY LOCALIZED OSTEOARTHROSIS HAND 06/13/2007  . THYROIDECTOMY, HX OF 09/21/2006    Past Surgical History    Procedure Date  . Abdominal hysterectomy   . Cataract extraction   . Thyroidectomy     History reviewed. No pertinent family history.  History  Substance Use Topics  . Smoking status: Former Smoker    Quit date: 01/14/1979  . Smokeless tobacco: Never Used  . Alcohol Use: No    OB History    Grav Para Term Preterm Abortions TAB SAB Ect Mult Living                  Review of Systems  Constitutional: Positive for fatigue. Negative for fever, chills and weight loss.  HENT: Negative.   Eyes: Negative.  Negative for discharge and redness.  Respiratory: Negative.  Negative for cough and shortness of breath.   Cardiovascular: Negative.  Negative for chest pain.  Gastrointestinal: Positive for diarrhea. Negative for nausea, vomiting, abdominal pain, melena, hematochezia, hematemesis and jaundice.  Genitourinary: Negative.  Negative for dysuria and vaginal discharge.  Musculoskeletal: Negative.  Negative for myalgias, back pain and arthralgias.  Skin: Negative.  Negative for color change and rash.  Neurological: Negative.  Negative for syncope and headaches.  Hematological: Negative.  Negative for adenopathy.  Psychiatric/Behavioral: Negative.  Negative for confusion.  All other systems reviewed and are negative.    Allergies  Codeine  Home Medications   Current Outpatient Rx  Name Route Sig Dispense Refill  . AMITRIPTYLINE HCL 25 MG PO TABS Oral Take 25 mg by mouth at bedtime.    Marland Kitchen AMLODIPINE BESYLATE 5 MG PO TABS Oral Take 1 tablet (5  mg total) by mouth daily. 30 tablet 11  . ASPIRIN 81 MG PO TABS Oral Take 81 mg by mouth 3 (three) times daily.     Marland Kitchen CALCIUM CARBONATE-VITAMIN D 500-200 MG-UNIT PO TABS Oral Take 1 tablet by mouth daily.      . OMEGA-3 FATTY ACIDS 1000 MG PO CAPS Oral Take 1 g by mouth daily.      Marland Kitchen FLUTICASONE-SALMETEROL 250-50 MCG/DOSE IN AEPB Inhalation Inhale 1 puff into the lungs 2 (two) times daily. 60 each 12  . FUROSEMIDE 40 MG PO TABS Oral Take 1  tablet (40 mg total) by mouth daily. 30 tablet 11  . INSULIN ISOPHANE & REGULAR (70-30) 100 UNIT/ML [place] SUSP  Inject 35 units every morning and 20 units in the evening 10 mL 11  . LEVOTHYROXINE SODIUM 100 MCG PO TABS Oral Take 1 tablet (100 mcg total) by mouth daily. 30 tablet 11  . MULTI-VITAMIN/MINERALS PO TABS Oral Take 1 tablet by mouth daily.      Marland Kitchen ALIGN 4 MG PO CAPS Oral Take 1 capsule by mouth daily.    Marland Kitchen VALSARTAN 160 MG PO TABS Oral Take 1 tablet (160 mg total) by mouth daily. 30 tablet 11  . VITAMIN E 400 UNITS PO CAPS Oral Take 400 Units by mouth daily.      Marland Kitchen GLUCOSE BLOOD VI STRP  Use as instructed 100 each 11  . INSULIN SYRINGE-NEEDLE U-100 31G X 5/16" 0.5 ML MISC  Use as directed to administer insulin 100 each 5    BP 185/57  Pulse 97  Temp(Src) 98.1 F (36.7 C) (Oral)  Resp 25  SpO2 97%  Physical Exam  Nursing note and vitals reviewed. Constitutional: She is oriented to person, place, and time. She appears well-developed and well-nourished.  Non-toxic appearance. She does not have a sickly appearance.  HENT:  Head: Normocephalic and atraumatic.  Eyes: Conjunctivae, EOM and lids are normal. Pupils are equal, round, and reactive to light. No scleral icterus.  Neck: Trachea normal and normal range of motion. Neck supple.  Cardiovascular: Normal rate, regular rhythm and normal heart sounds.   Pulmonary/Chest: Effort normal and breath sounds normal.  Abdominal: Soft. Normal appearance. She exhibits no distension. There is no tenderness. There is no rebound, no guarding and no CVA tenderness.  Musculoskeletal: Normal range of motion.  Neurological: She is alert and oriented to person, place, and time. She has normal strength.  Skin: Skin is warm, dry and intact. No rash noted.  Psychiatric: She has a normal mood and affect. Her behavior is normal. Judgment and thought content normal.    ED Course  Procedures (including critical care time)  Results for orders placed  during the hospital encounter of 03/16/11  CBC      Component Value Range   WBC 9.0  4.0 - 10.5 (K/uL)   RBC 5.41 (*) 3.87 - 5.11 (MIL/uL)   Hemoglobin 15.5 (*) 12.0 - 15.0 (g/dL)   HCT 28.4  13.2 - 44.0 (%)   MCV 84.1  78.0 - 100.0 (fL)   MCH 28.7  26.0 - 34.0 (pg)   MCHC 34.1  30.0 - 36.0 (g/dL)   RDW 10.2  72.5 - 36.6 (%)   Platelets 188  150 - 400 (K/uL)  DIFFERENTIAL      Component Value Range   Neutrophils Relative 64  43 - 77 (%)   Neutro Abs 5.7  1.7 - 7.7 (K/uL)   Lymphocytes Relative 27  12 - 46 (%)  Lymphs Abs 2.4  0.7 - 4.0 (K/uL)   Monocytes Relative 8  3 - 12 (%)   Monocytes Absolute 0.7  0.1 - 1.0 (K/uL)   Eosinophils Relative 0  0 - 5 (%)   Eosinophils Absolute 0.0  0.0 - 0.7 (K/uL)   Basophils Relative 0  0 - 1 (%)   Basophils Absolute 0.0  0.0 - 0.1 (K/uL)  BASIC METABOLIC PANEL      Component Value Range   Sodium 139  135 - 145 (mEq/L)   Potassium 4.0  3.5 - 5.1 (mEq/L)   Chloride 106  96 - 112 (mEq/L)   CO2 22  19 - 32 (mEq/L)   Glucose, Bld 64 (*) 70 - 99 (mg/dL)   BUN 15  6 - 23 (mg/dL)   Creatinine, Ser 2.13 (*) 0.50 - 1.10 (mg/dL)   Calcium 8.6  8.4 - 08.6 (mg/dL)   GFR calc non Af Amer 39 (*) >90 (mL/min)   GFR calc Af Amer 45 (*) >90 (mL/min)  GLUCOSE, CAPILLARY      Component Value Range   Glucose-Capillary 64 (*) 70 - 99 (mg/dL)   Dg Abd Acute W/chest  03/16/2011  *RADIOLOGY REPORT*  Clinical Data: Diarrhea with abdominal distention.  ACUTE ABDOMEN SERIES (ABDOMEN 2 VIEW & CHEST 1 VIEW)  Comparison: [facility] abdomen and pelvis CT exam dated 01/16/2011  Findings: Upright chest shows no focal airspace consolidation or pulmonary edema. Interstitial markings are diffusely coarsened with chronic features.  The cardiopericardial silhouette is enlarged.  Upright film shows no evidence for intraperitoneal free air. Supine abdomen shows diffuse gas-filled loops of large and small bowel.  Some small bowel loops in the left upper and and measure up  to 3.3 cm in diameter.  Bones diffusely demineralized.  Degenerative disc disease is seen in the lumbar spine.  IMPRESSION: No acute cardiopulmonary findings.  No intraperitoneal free air.  Mild diffuse gaseous distention of large and small bowel.  Gas is visible in nondilated left colon.  Imaging features could be related to a degree of ileus.  Original Report Authenticated By: ERIC A. MANSELL, M.D.      MDM  Likely viral diarrhea illness.  She is maintaining by mouth intake here today and has normal vital signs except some mild hypertension and the patient has a known history of hypertension and is taking her medications at home but likely has some increased illumination of them due to her increased diarrhea.  Patient does note that even while she's here this morning the diarrhea is slowing down.  Notably the son who lives with her on his left emergency department because he's developed similar symptoms which further clarifies that this is likely a viral infectious illness at this time.  I feel the patient is safe for discharge home given good hydration status and she's shown that she is able to ambulate around the emergency department with minimal assistance.        Nat Christen, MD 03/16/11 1537

## 2011-03-16 NOTE — Telephone Encounter (Signed)
Tried calling patient back, left voice message on machine.  Will try back soon.

## 2011-03-16 NOTE — Telephone Encounter (Signed)
[facility] Triage Call Report Triage Record Num: [id number] Operator: Sula Rumple Patient Name: [name] Call Date & Time: 03/15/2011 5:37:22PM Patient Phone: [phone] PCP: Illene Regulus Patient Gender: Female PCP Fax : [phone] Patient DOB: 11/14/1917 [facility] Name: [facility] Reason for Call: Caller: Terry/Other; PCP: Illene Regulus; CB#: [phone]; Call Reason: Diarrhea; Sx Onset: 03/15/2011; Sx Notes: ; Afebrile; Wt: ; Guideline Used: ; Disp:; Appt Scheduled?: Son/Terry states pt is having diarrhea/started 03/12/11 states improved on 03/14/11 but has restarted today/in the last 4 hours pt is having frequent episodes of diarrhea/BS is 115/pt has taken OTC anti diarrheal medication/ per Dr Sharen Hones may try Immodium/unable to schedule appt/advised to call in AM Protocol(s) Used: Diarrhea or Other Change in Bowel Habits Recommended Outcome per Protocol: See Provider within 24 hours Reason for Outcome: Diarrhea lasting longer than 24 hours AND not improving with home care Care Advice: ~ Maintain dietary recommendations for pre-existing conditions. Diarrheal Care: - Drink 2-3 quarts (2-3 liters) per day of low sugar content fluids, including over the counter oral hydration solution, unless directed otherwise by provider. - If accompanied by vomiting, take the fluids in frequent small sips or suck on ice chips. - Eat easily digested foods (such as bananas, rice, applesauce, toast, cooked cereals, soup, crackers, baked or boiled potato, or baked chicken or [place] without skin). - Do not eat high fiber, high fat, high sugar content foods, or highly seasoned foods. - Do not drink caffeinated or alcoholic beverages. - Avoid milk and milk products while having symptoms. As symptoms improve, gradually add back to diet. - Application of A&D ointment or witch hazel medicated pads may help anal irritation. - Antidiarrheal medications are usually unnecessary. If symptoms are  severe, consider nonprescription antidiarrheal and anti-motility drugs as directed by label or a provider. Do not take if have high fever or bloody diarrhea. If pregnant, do not take any medications not approved by your provider. - Consult your provider for advice regarding continuing prescription medication. ~ 03/15/2011 7:46:47PM Page 1 of 1 [email or web address]

## 2011-03-16 NOTE — ED Notes (Signed)
Per ems pt from home and has had diarrhea since Thursday with 7 episodes today. Diarrhea is non-bloody and pt has reported some nausea. Tried imodium with no relief. Denies fever.

## 2011-04-30 NOTE — Telephone Encounter (Signed)
Patient was given tests results for CT that was performed in Jan on 02-20-11.

## 2011-09-02 ENCOUNTER — Telehealth: Payer: Self-pay | Admitting: Internal Medicine

## 2011-09-07 ENCOUNTER — Ambulatory Visit (INDEPENDENT_AMBULATORY_CARE_PROVIDER_SITE_OTHER): Payer: Medicare Other | Admitting: Internal Medicine

## 2011-09-07 ENCOUNTER — Encounter: Payer: Self-pay | Admitting: Internal Medicine

## 2011-09-07 VITALS — BP 160/68 | HR 97 | Temp 98.3°F | Resp 16 | Wt 138.0 lb

## 2011-09-07 DIAGNOSIS — I10 Essential (primary) hypertension: Secondary | ICD-10-CM

## 2011-09-07 DIAGNOSIS — J4489 Other specified chronic obstructive pulmonary disease: Secondary | ICD-10-CM

## 2011-09-07 DIAGNOSIS — R21 Rash and other nonspecific skin eruption: Secondary | ICD-10-CM

## 2011-09-07 DIAGNOSIS — M26629 Arthralgia of temporomandibular joint, unspecified side: Secondary | ICD-10-CM

## 2011-09-07 DIAGNOSIS — M2669 Other specified disorders of temporomandibular joint: Secondary | ICD-10-CM

## 2011-09-07 DIAGNOSIS — J449 Chronic obstructive pulmonary disease, unspecified: Secondary | ICD-10-CM

## 2011-09-07 MED ORDER — DICLOFENAC SODIUM 1 % TD GEL: 2.0000 g | Freq: Four times a day (QID) | TRANSDERMAL | Status: DC

## 2011-09-07 MED ORDER — PROMETHAZINE-CODEINE 6.25-10 MG/5ML PO SYRP: 5.0000 mL | ORAL_SOLUTION | Freq: Four times a day (QID) | ORAL | Status: AC | PRN

## 2011-09-07 NOTE — Patient Instructions (Addendum)
1. Scalp rash and itching - I don't see a rash I recognize. Plan For itching take loratadine 10 mg once a day, take ranitidine 150 mg twice a day.  Will refer you to a dermatologist  2. Pain at the ear and left side of your head - there is tenderness at the left Tempromandibular joint (TMJ). Plan Aspercreme as needed  Voltaren gel 2 g applied over the left TMJ 4 times a day - this is a topical anti-inflammatory drug to help with the inflammation in the joint.  3. Numbness in the hands - seems to be related to your arthritis. Plan - continue with the warm soaks or warmth via glove every morning to help with the symptoms  4. Cough - lungs are clear. No wheezing off the Advair.  Plan - promethazine with codeine 1 tsp every 6 hours as needed. Go easy on this stuff.   Temporomandibular Problems   Temporomandibular joint (TMJ) dysfunction means there are problems with the joint between your jaw and your skull. This is a joint lined by cartilage like other joints in your body but also has a small disc in the joint which keeps the bones from rubbing on each other. These joints are like other joints and can get inflamed (sore) from arthritis and other problems. When this joint gets sore, it can cause headaches and pain in the jaw and the face. CAUSES   Usually the arthritic types of problems are caused by soreness in the joint. Soreness in the joint can also be caused by overuse. This may come from grinding your teeth. It may also come from mis-alignment in the joint. DIAGNOSIS Diagnosis of this condition can often be made by history and exam. Sometimes your caregiver may need X-rays or an MRI scan to determine the exact cause. It may be necessary to see your dentist to determine if your teeth and jaws are lined up correctly. TREATMENT   Most of the time this problem is not serious; however, sometimes it can persist (become chronic). When this happens medications that will cut down on inflammation  (soreness) help. Sometimes a shot of cortisone into the joint will be helpful. If your teeth are not aligned it may help for your dentist to make a splint for your mouth that can help this problem. If no physical problems can be found, the problem may come from tension. If tension is found to be the cause, biofeedback or relaxation techniques may be helpful. HOME CARE INSTRUCTIONS    Later in the day, applications of ice packs may be helpful. Ice can be used in a plastic bag with a towel around it to prevent frostbite to skin. This may be used about every 2 hours for 20 to 30 minutes, as needed while awake, or as directed by your caregiver.   Only take over-the-counter or prescription medicines for pain, discomfort, or fever as directed by your caregiver.   If physical therapy was prescribed, follow your caregiver's directions.   Wear mouth appliances as directed if they were given.  Document Released: 09/23/2000 Document Revised: 12/18/2010 Document Reviewed: 01/01/2008 [facility] Patient Information 2012 [place], [place].

## 2011-09-07 NOTE — Progress Notes (Signed)
Subjective:    Patient ID: [name], female    DOB: [birth year], 78 y.o.   MRN: [id number]  HPI [name] presents with several complaints:  She has a rash on her forehead and scalp which is very pruritic. No new contact allergens or other exposure. No vesicles or open lesions. NO pain.  For several days she has been having pain at the left temple/parietal region and pain at the left ear. No report of trauma, blurred or changed vision, hearing loss.  In the AM her hands will be fine when she wakes up but soon thereafter she will have tingling and numbness that will last about 5 minutes. Her morning routine of soaking her hands in warm water will relieve her discomfort.  Past Medical History  Diagnosis Date  . CATARACT EXTRACTION, HX OF 09/21/2006  . COLONIC POLYPS, HX OF 09/21/2006  . COPD 09/21/2006  . DIABETES MELLITUS 09/21/2006  . DVT 09/21/2006  . Headache 06/13/2007  . HYPERLIPIDEMIA 09/21/2006  . HYPERTENSION 09/21/2006  . HYPOTHYROIDISM 09/21/2006  . HYSTERECTOMY, HX OF 09/21/2006  . NEURALGIA, TRIGEMINAL 10/01/2008  . OSTEOPOROSIS 09/21/2006  . PLANTAR FASCIITIS 09/21/2006  . PRIMARY LOCALIZED OSTEOARTHROSIS HAND 06/13/2007  . THYROIDECTOMY, HX OF 09/21/2006   Past Surgical History  Procedure Date  . Abdominal hysterectomy   . Cataract extraction   . Thyroidectomy    No family history on file. History   Social History  . Marital Status: Widowed    Spouse Name: N/A    Number of Children: 4  . Years of Education: 4th   Occupational History  . retired    Social History Main Topics  . Smoking status: Former Smoker    Quit date: 01/14/1979  . Smokeless tobacco: Never Used  . Alcohol Use: No  . Drug Use: No  . Sexually Active: No   Other Topics Concern  . Not on file   Social History Narrative   4th grade. Married '44 -'50 yrs/Widowed. 3 sons -'11, '46, '51, ; 2 dtrs ' 47, one stillborn. Work - Theatre manager, [employer], Production assistant, radio. Lives alone, very independent, continues  to garden, does all her own housework, avid reader. End of life care (01/28/09) in a discussion about the recent death sisiter she is very clear that she does not want heroic measures; no CPR, DNI, no other heroic measures. Will send a packet with out of facility order, "Layments's Guide" most form.    Current Outpatient Prescriptions on File Prior to Visit  Medication Sig Dispense Refill  . amitriptyline (ELAVIL) 25 MG tablet Take 25 mg by mouth at bedtime.      Marland Kitchen amLODipine (NORVASC) 5 MG tablet Take 1 tablet (5 mg total) by mouth daily.  30 tablet  11  . aspirin 81 MG tablet Take 81 mg by mouth 3 (three) times daily.       . calcium-vitamin D (OSCAL WITH D) 500-200 MG-UNIT per tablet Take 1 tablet by mouth daily.        . fish oil-omega-3 fatty acids 1000 MG capsule Take 1 g by mouth daily.        . furosemide (LASIX) 40 MG tablet Take 1 tablet (40 mg total) by mouth daily.  30 tablet  11  . glucose blood (ONE TOUCH ULTRA TEST) test strip Use as instructed  100 each  11  . insulin NPH-insulin regular (HUMULIN 70/30) (70-30) 100 UNIT/ML injection Inject 35 units every morning and 20 units in the evening  10  mL  11  . Insulin Syringe-Needle U-100 (B-D INS SYRINGE 0.5CC/31GX5/16) 31G X 5/16" 0.5 ML MISC Use as directed to administer insulin  100 each  5  . levothyroxine (SYNTHROID, LEVOTHROID) 100 MCG tablet Take 1 tablet (100 mcg total) by mouth daily.  30 tablet  11  . Multiple Vitamins-Minerals (MULTIVITAMIN WITH MINERALS) tablet Take 1 tablet by mouth daily.        . Probiotic Product (ALIGN) 4 MG CAPS Take 1 capsule by mouth daily.      . valsartan (DIOVAN) 160 MG tablet Take 1 tablet (160 mg total) by mouth daily.  30 tablet  11  . vitamin E 400 UNIT capsule Take 400 Units by mouth daily.        . Fluticasone-Salmeterol (ADVAIR DISKUS) 250-50 MCG/DOSE AEPB Inhale 1 puff into the lungs 2 (two) times daily.  60 each  12      Review of Systems System review is negative for any  constitutional, cardiac, pulmonary, GI or neuro symptoms or complaints other than as described in the HPI.     Objective:   Physical Exam Filed Vitals:   09/07/11 1556  BP: 160/68  Pulse: 97  Temp: 98.3 F (36.8 C)  Resp: 16   Gen'l - WNWD white woman looking much younger than her chronologic age. HEENT- C&S clear, PERRLA, left EAC with some cerumen but no impaction, TM appears normal. No palpable Templar artery left. Mild-moderate tenderness over the left TMJ Cor- RRR Pulm - normal respirations Derm - macular tan lesions, scattered and few, on the forehead. No visible lesions scalp. Ext - changes of OA DIP,PIP joints both hands. Mildly positive Tinel's and PHalen's sign, unable to fully close right hand due to joint swelling.       Assessment & Plan:  1. Scalp rash and itching - I don't see a rash I recognize. Plan For itching take loratadine 10 mg once a day, take ranitidine 150 mg twice a day.  Will refer you to a dermatologist  2. Pain at the ear and left side of your head - there is tenderness at the left Tempromandibular joint (TMJ). Plan Aspercreme as needed  Voltaren gel 2 g applied over the left TMJ 4 times a day - this is a topical anti-inflammatory drug to help with the inflammation in the joint.  3. Numbness in the hands - seems to be related to your arthritis. Plan - continue with the warm soaks or warmth via glove every morning to help with the symptoms

## 2011-09-08 NOTE — Assessment & Plan Note (Signed)
She reports that she stopped her Advair - feels it was a cause of chronic cough. She reports she still has a mild cough but it is better and she has not had any worsening of her COPD symptoms off medication. Lungs are clear on exam.  Plan - no change in regimen.

## 2011-09-08 NOTE — Assessment & Plan Note (Signed)
BP Readings from Last 3 Encounters:  09/07/11 160/68  03/16/11 158/64  01/14/11 124/84   Generally she has good control and reports that her home readings are controlled.  Plan Continue present medications.

## 2011-09-09 NOTE — Telephone Encounter (Signed)
Error

## 2011-09-24 ENCOUNTER — Other Ambulatory Visit: Payer: Self-pay | Admitting: Internal Medicine

## 2011-09-25 ENCOUNTER — Other Ambulatory Visit: Payer: Self-pay | Admitting: General Practice

## 2011-09-25 MED ORDER — INSULIN NPH ISOPHANE & REGULAR (70-30) 100 UNIT/ML ~~LOC~~ SUSP: SUBCUTANEOUS | Status: DC

## 2011-11-06 ENCOUNTER — Other Ambulatory Visit: Payer: Self-pay | Admitting: *Deleted

## 2011-11-06 NOTE — Telephone Encounter (Signed)
Prior authorization form faxed to  [employer] for medication voltaren gel.

## 2012-01-11 ENCOUNTER — Other Ambulatory Visit: Payer: Self-pay | Admitting: Internal Medicine

## 2012-01-11 DIAGNOSIS — Z1231 Encounter for screening mammogram for malignant neoplasm of breast: Secondary | ICD-10-CM

## 2012-01-19 ENCOUNTER — Encounter: Payer: Self-pay | Admitting: Internal Medicine

## 2012-01-19 ENCOUNTER — Ambulatory Visit (INDEPENDENT_AMBULATORY_CARE_PROVIDER_SITE_OTHER): Payer: Medicare Other | Admitting: Internal Medicine

## 2012-01-19 ENCOUNTER — Ambulatory Visit (INDEPENDENT_AMBULATORY_CARE_PROVIDER_SITE_OTHER)
Admission: RE | Admit: 2012-01-19 | Discharge: 2012-01-19 | Disposition: A | Discharge status: Home / Self Care | Payer: Medicare Other | Source: Ambulatory Visit | Attending: Internal Medicine | Admitting: Internal Medicine

## 2012-01-19 ENCOUNTER — Other Ambulatory Visit (INDEPENDENT_AMBULATORY_CARE_PROVIDER_SITE_OTHER): Payer: Medicare Other

## 2012-01-19 VITALS — BP 144/68 | HR 106 | Temp 98.2°F | Resp 12 | Ht 62.0 in | Wt 137.0 lb

## 2012-01-19 DIAGNOSIS — M47816 Spondylosis without myelopathy or radiculopathy, lumbar region: Secondary | ICD-10-CM

## 2012-01-19 DIAGNOSIS — R0602 Shortness of breath: Secondary | ICD-10-CM

## 2012-01-19 DIAGNOSIS — E785 Hyperlipidemia, unspecified: Secondary | ICD-10-CM

## 2012-01-19 DIAGNOSIS — E119 Type 2 diabetes mellitus without complications: Secondary | ICD-10-CM

## 2012-01-19 DIAGNOSIS — I10 Essential (primary) hypertension: Secondary | ICD-10-CM

## 2012-01-19 DIAGNOSIS — Z0000 Encounter for general adult medical examination without abnormal findings: Secondary | ICD-10-CM

## 2012-01-19 DIAGNOSIS — J449 Chronic obstructive pulmonary disease, unspecified: Secondary | ICD-10-CM

## 2012-01-19 DIAGNOSIS — E039 Hypothyroidism, unspecified: Secondary | ICD-10-CM

## 2012-01-19 DIAGNOSIS — J4489 Other specified chronic obstructive pulmonary disease: Secondary | ICD-10-CM

## 2012-01-19 DIAGNOSIS — M19049 Primary osteoarthritis, unspecified hand: Secondary | ICD-10-CM

## 2012-01-19 DIAGNOSIS — M47817 Spondylosis without myelopathy or radiculopathy, lumbosacral region: Secondary | ICD-10-CM

## 2012-01-19 DIAGNOSIS — Z23 Encounter for immunization: Secondary | ICD-10-CM

## 2012-01-19 LAB — CBC WITH DIFFERENTIAL/PLATELET
Basophils Absolute: 0.1 10*3/uL (ref 0.0–0.1)
Basophils Relative: 0.6 % (ref 0.0–3.0)
Eosinophils Absolute: 0.2 10*3/uL (ref 0.0–0.7)
HCT: 46.8 % — ABNORMAL HIGH (ref 36.0–46.0)
Hemoglobin: 15.5 g/dL — ABNORMAL HIGH (ref 12.0–15.0)
Lymphocytes Relative: 29.7 % (ref 12.0–46.0)
Lymphs Abs: 3.2 10*3/uL (ref 0.7–4.0)
MCHC: 33.1 g/dL (ref 30.0–36.0)
MCV: 85.3 fl (ref 78.0–100.0)
Neutro Abs: 6.4 10*3/uL (ref 1.4–7.7)
RBC: 5.49 Mil/uL — ABNORMAL HIGH (ref 3.87–5.11)
RDW: 14.6 % (ref 11.5–14.6)

## 2012-01-19 LAB — COMPREHENSIVE METABOLIC PANEL
AST: 25 U/L (ref 0–37)
BUN: 38 mg/dL — ABNORMAL HIGH (ref 6–23)
CO2: 30 mEq/L (ref 19–32)
Calcium: 10.2 mg/dL (ref 8.4–10.5)
Chloride: 101 mEq/L (ref 96–112)
Creatinine, Ser: 1.5 mg/dL — ABNORMAL HIGH (ref 0.4–1.2)
GFR: 33.93 mL/min — ABNORMAL LOW (ref >60.00)
Total Bilirubin: 0.7 mg/dL (ref 0.3–1.2)

## 2012-01-19 LAB — TSH: TSH: 0.88 u[IU]/mL (ref 0.35–5.50)

## 2012-01-19 LAB — BRAIN NATRIURETIC PEPTIDE: Pro B Natriuretic peptide (BNP): 38 pg/mL (ref 0.0–100.0)

## 2012-01-19 NOTE — Progress Notes (Signed)
Subjective:    Patient ID: [name], female    DOB: [birth year], 79 y.o.   MRN: [id number]  HPI The patient is here for annual Medicare wellness examination and management of other chronic and acute problems.  She has an awful cough - sputum is clear/white. She has had some mild SOB. She gets winded going to the mailbox.  Arthritis knee and back continues to bother her.    The risk factors are reflected in the social history.  The roster of all physicians providing medical care to patient - is listed in the Snapshot section of the chart.  Activities of daily living:  The patient is 100% inedpendent in all ADLs: dressing, toileting, feeding as well as independent mobility but doesn't drive and her mobility is limited by arthritis to the point when she can't work in the garden and going into the yard requires use of a cane.   Home safety : The patient has smoke detectors in the home. Falls - no falls during the year and home is fall safe. They wear seatbelts. No firearms at home   There is no risks for hepatitis, STDs or HIV. There is no history of blood transfusion. They have no travel history to infectious disease endemic areas of the world.  The patient has not seen their dentist-full dentures with good fit. They not seen their eye doctor in the last year. They deny any hearing difficulty and have not had audiologic testing in the last year.    They do not  have excessive sun exposure. Discussed the need for sun protection: hats, long sleeves and use of sunscreen if there is significant sun exposure.   Diet: the importance of a healthy diet is discussed. They do have a healthy diet. She still cooks - and she does eat 3 meals a day.  The patient has no regular exercise program.  The benefits of regular aerobic exercise were discussed.  Depression screen: there are no signs or vegative symptoms of depression- irritability, change in appetite, anhedonia,  sadness/tearfullness.  Cognitive assessment: the patient manages all their financial and personal affairs and is actively engaged.   The following portions of the patient's history were reviewed and updated as appropriate: allergies, current medications, past family history, past medical history,  past surgical history, past social history  and problem list.  Past Medical History  Diagnosis Date  . CATARACT EXTRACTION, HX OF 09/21/2006  . COLONIC POLYPS, HX OF 09/21/2006  . COPD 09/21/2006  . DIABETES MELLITUS 09/21/2006  . DVT 09/21/2006  . Headache 06/13/2007  . HYPERLIPIDEMIA 09/21/2006  . HYPERTENSION 09/21/2006  . HYPOTHYROIDISM 09/21/2006  . HYSTERECTOMY, HX OF 09/21/2006  . NEURALGIA, TRIGEMINAL 10/01/2008  . OSTEOPOROSIS 09/21/2006  . PLANTAR FASCIITIS 09/21/2006  . PRIMARY LOCALIZED OSTEOARTHROSIS HAND 06/13/2007  . THYROIDECTOMY, HX OF 09/21/2006   Past Surgical History  Procedure Date  . Abdominal hysterectomy   . Cataract extraction   . Thyroidectomy    History reviewed. No pertinent family history. History   Social History  . Marital Status: Widowed    Spouse Name: N/A    Number of Children: 4  . Years of Education: 4th   Occupational History  . retired    Social History Main Topics  . Smoking status: Former Smoker    Quit date: 01/14/1979  . Smokeless tobacco: Never Used  . Alcohol Use: No  . Drug Use: No  . Sexually Active: No   Other Topics Concern  . Not  on file   Social History Narrative   4th grade. Married '44 -'50 yrs/Widowed. 3 sons -'74, '46, '51, ; 2 dtrs ' 47, one stillborn. Work - Theatre manager, [employer], Production assistant, radio. Lives alone, very independent, continues to garden, does all her own housework, avid reader. End of life care (01/28/09) in a discussion about the recent death sisiter she is very clear that she does not want heroic measures; no CPR, DNI, no other heroic measures. Will send a packet with out of facility order, "[employer]'s Guide" most form.    Current  Outpatient Prescriptions on File Prior to Visit  Medication Sig Dispense Refill  . amitriptyline (ELAVIL) 25 MG tablet Take 25 mg by mouth at bedtime.      Marland Kitchen amLODipine (NORVASC) 5 MG tablet Take 1 tablet (5 mg total) by mouth daily.  30 tablet  11  . aspirin 81 MG tablet Take 81 mg by mouth 3 (three) times daily.       . calcium-vitamin D (OSCAL WITH D) 500-200 MG-UNIT per tablet Take 1 tablet by mouth daily.        . diclofenac sodium (VOLTAREN) 1 % GEL Apply 2 g topically 4 (four) times daily.  100 g  3  . fish oil-omega-3 fatty acids 1000 MG capsule Take 1 g by mouth daily.        . furosemide (LASIX) 40 MG tablet Take 1 tablet (40 mg total) by mouth daily.  30 tablet  11  . glucose blood (ONE TOUCH ULTRA TEST) test strip Use as instructed  100 each  11  . insulin NPH-insulin regular (HUMULIN 70/30) (70-30) 100 UNIT/ML injection Inject 35 units every morning and 20 units in the evening  10 mL  11  . Insulin Syringe-Needle U-100 (B-D INS SYRINGE 0.5CC/31GX5/16) 31G X 5/16" 0.5 ML MISC Use as directed to administer insulin  100 each  5  . levothyroxine (SYNTHROID, LEVOTHROID) 100 MCG tablet Take 1 tablet (100 mcg total) by mouth daily.  30 tablet  11  . Multiple Vitamins-Minerals (MULTIVITAMIN WITH MINERALS) tablet Take 1 tablet by mouth daily.        . Probiotic Product (ALIGN) 4 MG CAPS Take 1 capsule by mouth daily.      . valsartan (DIOVAN) 160 MG tablet Take 1 tablet (160 mg total) by mouth daily.  30 tablet  11  . vitamin E 400 UNIT capsule Take 400 Units by mouth daily.        . Fluticasone-Salmeterol (ADVAIR DISKUS) 250-50 MCG/DOSE AEPB Inhale 1 puff into the lungs 2 (two) times daily.  60 each  12     Vision, hearing, body mass index were assessed and reviewed.   During the course of the visit the patient was educated and counseled about appropriate screening and preventive services including : fall prevention , diabetes screening, nutrition counseling, colorectal cancer screening,  and recommended immunizations.    Review of Systems Constitutional:  Negative for fever, chills, activity change and unexpected weight change.  HEENT:  Negative for hearing loss, ear pain, congestion, neck stiffness and postnasal drip. Negative for sore throat or swallowing problems. Negative for dental complaints.   Eyes: Negative for vision loss or change in visual acuity.  Respiratory: Negative for chest tightness and wheezing. Negative for DOE.   Cardiovascular: Negative for chest pain or palpitations. No decreased exercise tolerance Gastrointestinal: No change in bowel habit. No bloating or gas. No reflux or indigestion Genitourinary: Negative for urgency, frequency, flank pain and difficulty  urinating.  Musculoskeletal: Positive for myalgias, back pain, arthralgias and gait problem.  Neurological: Negative for dizziness, tremors, weakness and headaches.  Hematological: Negative for adenopathy.  Psychiatric/Behavioral: Negative for behavioral problems and dysphoric mood.       Objective:   Physical Exam Filed Vitals:   01/19/12 0951  BP: 144/68  Pulse: 106  Temp: 98.2 F (36.8 C)  Resp: 12   Gen'l- WNWD well preserved nonogenarian HEENT- [place]/AT, C&S clear, oropharynx w/o lesions Neck - supple, w/o thyromegaly Nodes - negative Cor 2+ radial, RRR, no JVD, no carotid bruits PUlm - normal respirations w/o rales or wheezing. Coarse wet cough noted. Abd- soft MSK - moderate arthritic changes DIP,PIP joints, no erythema, no synovial changes, preserved ROM small,medium and large joints Neuro - A&O x 3, using walker for gait - does well, no tremor. Foot exam - normal light touch and pin prick sensation, very decreased vibratory sensation.  Lab Results  Component Value Date   WBC 10.9* 01/19/2012   HGB 15.5* 01/19/2012   HCT 46.8* 01/19/2012   PLT 232.0 01/19/2012   GLUCOSE 280* 01/19/2012   CHOL 169 12/26/2009   TRIG 218.0* 12/26/2009   HDL 41.30 12/26/2009   LDLDIRECT 92.1  12/26/2009   LDLCALC 70 01/28/2009   ALT 25 01/19/2012   AST 25 01/19/2012   NA 138 01/19/2012   K 5.5* 01/19/2012   CL 101 01/19/2012   CREATININE 1.5* 01/19/2012   BUN 38* 01/19/2012   CO2 30 01/19/2012   TSH 0.88 01/19/2012   HGBA1C 8.4* 01/19/2012   MICROALBUR 0.2 06/25/2006       pBNP                     38  CXR 2 view: Findings: There is hyperinflation of the lungs compatible with  COPD. Linear densities in the right middle lobe and left base  compatible with scarring. Calcifications and mild tortuosity in  the aorta. Heart is normal size. No effusions. No acute bony  abnormality.  IMPRESSION:  COPD/chronic changes. No acute findings.         Assessment & Plan:  Cough - clear on exam. Lab and x-ray without evidence of acute infection or CHF  Plan Cough syrup of choice

## 2012-01-19 NOTE — Patient Instructions (Addendum)
Thanks for coming to see me.  Your exam is normal except for that pesky cough. Will check a chest x-ray today and some special labwork in addition to the routine lab work.   It sounds like your sugar has been under good control and the lab will tell the tale.  Flu shot today.  I will be sending you a full report.

## 2012-01-20 ENCOUNTER — Encounter: Payer: Self-pay | Admitting: Internal Medicine

## 2012-01-20 NOTE — Assessment & Plan Note (Addendum)
Interval medical history has been benign, notable for decrease in activity, e.g. Gardening, due to OA and back pain. Limited physical exam is OK. Lab results reveal elevated A1C otherwise OK. She had colonoscopy in '00 and is aged out of screening. Immunizations are current.  ACP - DNR, DNI, no heroic measures  In summary - a delightful woman who is doing well.

## 2012-01-20 NOTE — Assessment & Plan Note (Signed)
BP Readings from Last 3 Encounters:  01/19/12 144/68  09/07/11 160/68  03/16/11 158/64   Borderline control but very acceptable for her age.   Plan Continue present medical regimen

## 2012-01-20 NOTE — Assessment & Plan Note (Signed)
Mild to moderate joint deformity with some grip limitations. However, she does manage all her ADLs

## 2012-01-20 NOTE — Assessment & Plan Note (Signed)
Fabulous lipid panel with life style management only.  Plan Keep doing what she is doing.

## 2012-01-20 NOTE — Assessment & Plan Note (Signed)
A1C 8.4 %. At age 79, with no symptoms will continue present regimen without change. She is encouraged to be careful with her diet.

## 2012-01-20 NOTE — Assessment & Plan Note (Signed)
X-ray confirms COPD. Aside from her current cough she is doing very well with her current medications. She does not report problems with breakthrough wheezing.  Plan Continue present regimen.

## 2012-01-20 NOTE — Assessment & Plan Note (Signed)
Patient c/o low back pain and reports that her back pain does limit her ambulation and she has now given up working in the garden due in large measure to her back pain. Her only medication is otc APAP.  Plan For any increase in pain will consider use of NSAIDs.  Wrote Rx for 3 wheel walker with seat and handbrakes.

## 2012-01-20 NOTE — Assessment & Plan Note (Signed)
TSH in controlled range.  Plan  Continue present medication at current dose

## 2012-01-23 ENCOUNTER — Other Ambulatory Visit: Payer: Self-pay | Admitting: Internal Medicine

## 2012-01-25 ENCOUNTER — Other Ambulatory Visit: Payer: Self-pay | Admitting: *Deleted

## 2012-01-25 MED ORDER — AMITRIPTYLINE HCL 25 MG PO TABS: 25.0000 mg | ORAL_TABLET | Freq: Every day | ORAL | Status: DC

## 2012-01-25 MED ORDER — AMLODIPINE BESYLATE 5 MG PO TABS: 5.0000 mg | ORAL_TABLET | Freq: Every day | ORAL | Status: DC

## 2012-01-25 MED ORDER — VALSARTAN 160 MG PO TABS: 160.0000 mg | ORAL_TABLET | Freq: Every day | ORAL | Status: DC

## 2012-01-25 MED ORDER — LEVOTHYROXINE SODIUM 100 MCG PO TABS: 100.0000 ug | ORAL_TABLET | Freq: Every day | ORAL | Status: DC

## 2012-01-25 MED ORDER — FUROSEMIDE 40 MG PO TABS: 40.0000 mg | ORAL_TABLET | Freq: Every day | ORAL | Status: DC

## 2012-02-03 ENCOUNTER — Telehealth: Payer: Self-pay | Admitting: *Deleted

## 2012-02-03 NOTE — Telephone Encounter (Signed)
[name], [name], CALLED CONCERN OF LETTER RECEIVED OF [employer] OF [place] DIOVAN WILL NOT BE COVERED ON HER INSURANCE FORMULARY AFTER 30 DAYS. [name] WILL FAX FORM TO OUR OFFICE FOR DR. Debby Bud TO REVIEW. . [name] WOULD LIKE FOR DR,. NORINS TO SUBMIT REQUEST TO SUPPORT FOR HIS MOM TO CONTINUE ON DIOVAN DUE TO HER AGE AND NOT WANTING TO CHANGE ANY OF HER MEDICATION SINCE THIS HAS ALWAYS BEEN ON HER MED LIST FOR QUITE SOMETIME AND DOES WELL WITH THIS. DIOVAN. CB# [phone]

## 2012-02-04 ENCOUNTER — Other Ambulatory Visit: Payer: Self-pay | Admitting: Internal Medicine

## 2012-02-04 NOTE — Telephone Encounter (Signed)
[name] the patient son is going to fax letter and form to Dr. Debby Bud for this. sue

## 2012-02-04 NOTE — Telephone Encounter (Signed)
Received notification- need PA done or subsitution - there is a website on the form. Will kindly request assist with this and several PA or drug changes - it is tht time of year, New Formulary time.

## 2012-02-10 ENCOUNTER — Ambulatory Visit: Payer: Medicare Other

## 2012-02-11 ENCOUNTER — Other Ambulatory Visit: Payer: Self-pay | Admitting: *Deleted

## 2012-02-11 MED ORDER — LOSARTAN POTASSIUM 50 MG PO TABS: 50.0000 mg | ORAL_TABLET | Freq: Every day | ORAL | Status: DC

## 2012-02-11 NOTE — Telephone Encounter (Signed)
Medicare Part D does not cover pt's former rx of diovan. Change to losartan, per Dr Debby Bud.

## 2012-05-11 IMAGING — CR DG ABDOMEN ACUTE W/ 1V CHEST
3 series · 3 of 3 positions shown · non-contrast
Comparison: [HOSPITAL] abdomen and pelvis CT exam dated
01/16/2011

CLINICAL DATA: Diarrhea with abdominal distention.

ACUTE ABDOMEN SERIES (ABDOMEN 2 VIEW & CHEST 1 VIEW)

[w chest pa]
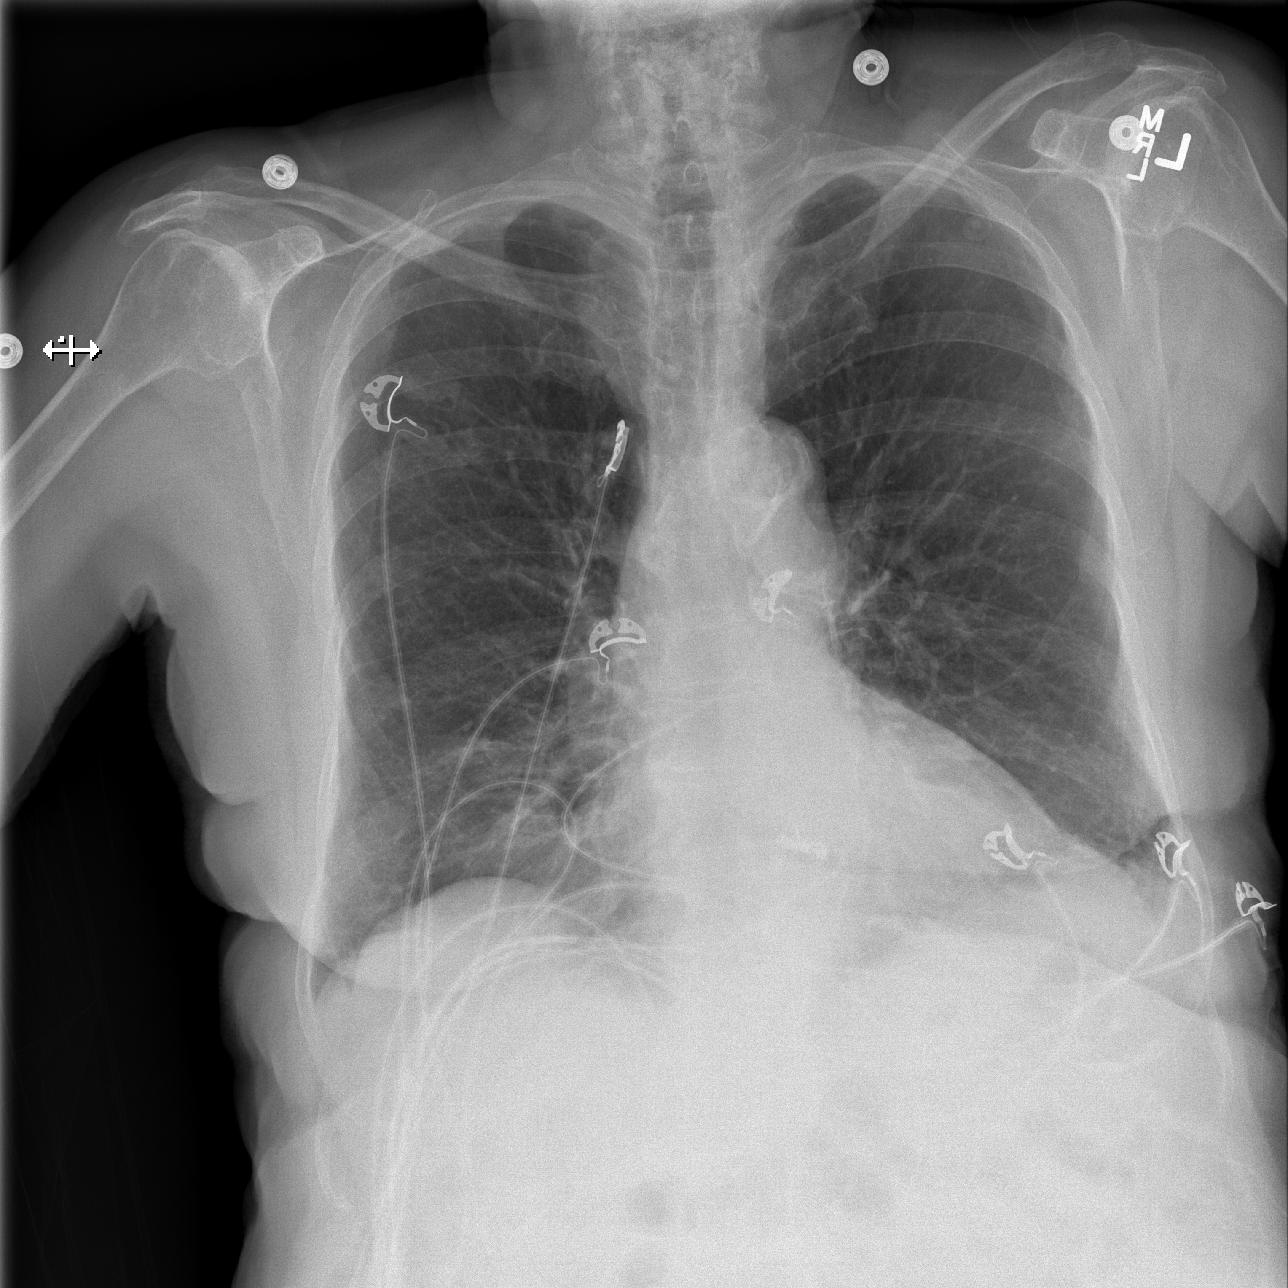

[w abdomen upright]
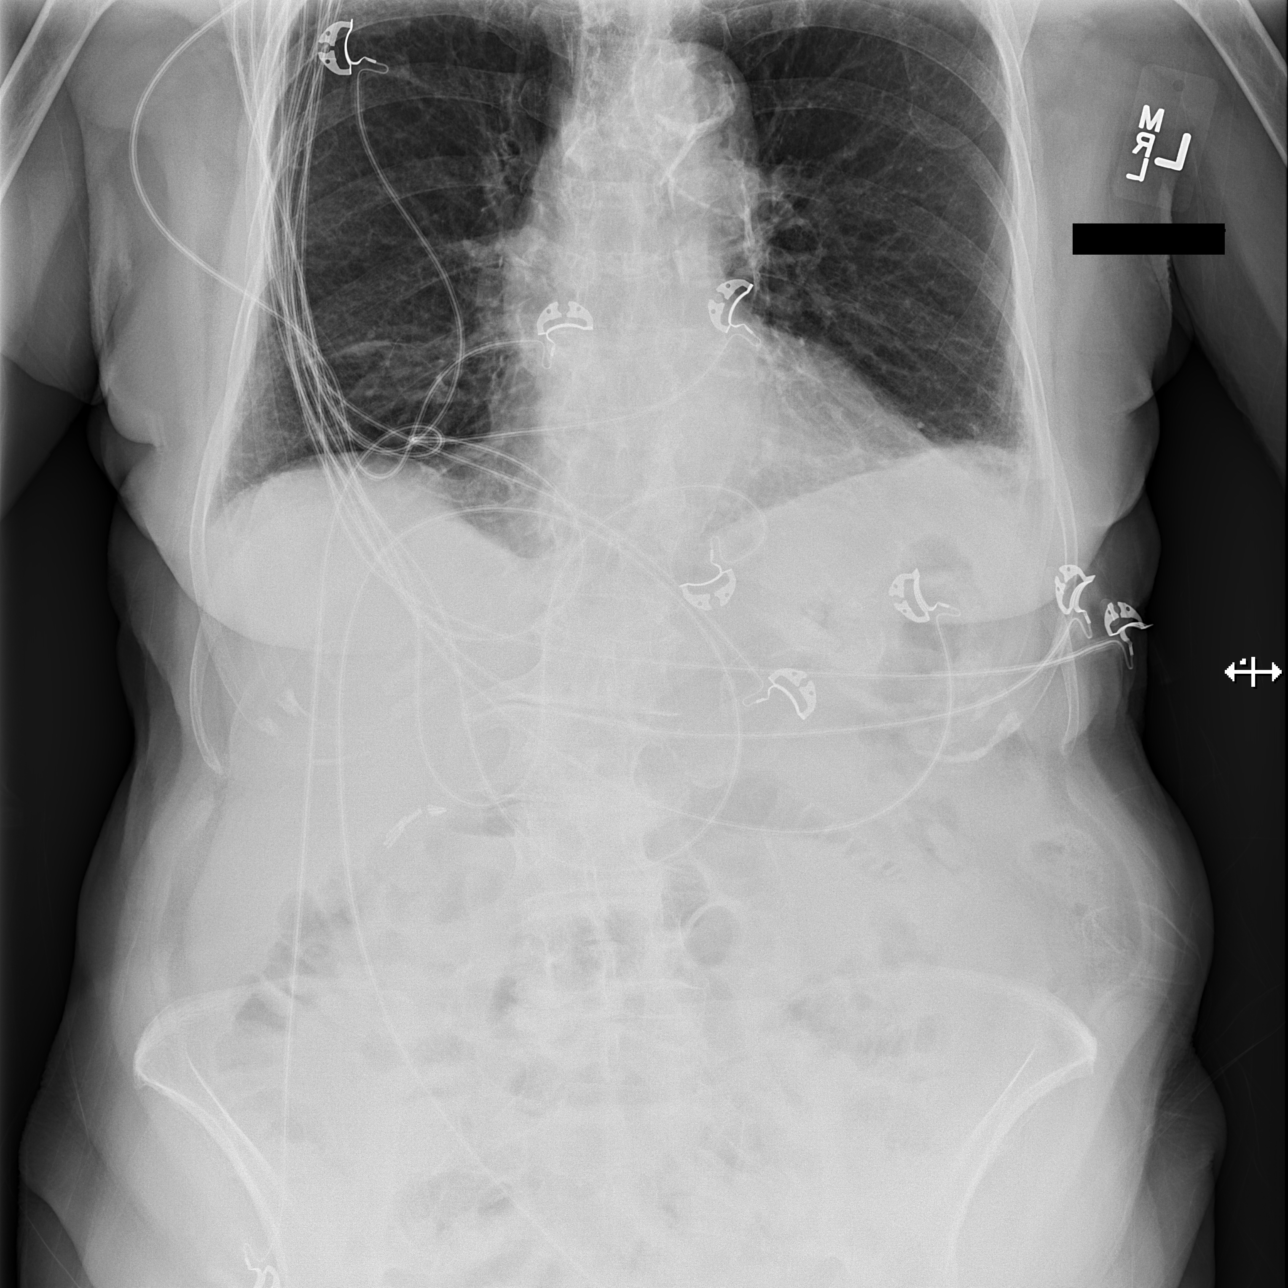

[t abdomen supine]
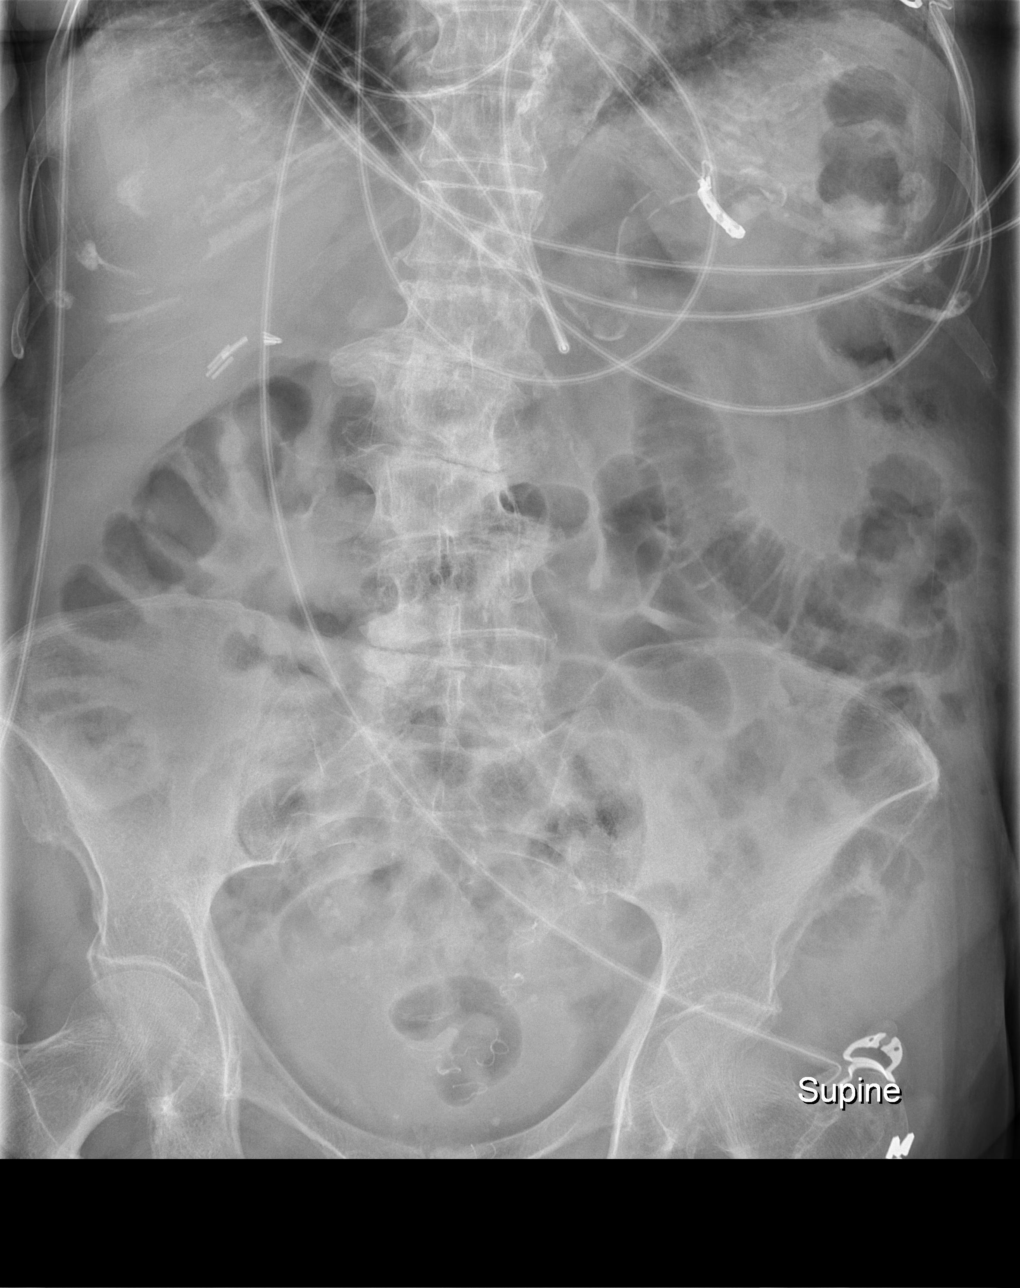

[3 of 3 positions shown; findings below may reference images not displayed]

FINDINGS: Upright chest shows no focal airspace consolidation or
pulmonary edema. Interstitial markings are diffusely coarsened with
chronic features.  The cardiopericardial silhouette is enlarged.

Upright film shows no evidence for intraperitoneal free air.
Supine abdomen shows diffuse gas-filled loops of large and small
bowel.  Some small bowel loops in the left upper and and measure up
to 3.3 cm in diameter.

Bones diffusely demineralized.  Degenerative disc disease is seen
in the lumbar spine.
IMPRESSION: No acute cardiopulmonary findings.

No intraperitoneal free air.

Mild diffuse gaseous distention of large and small bowel.  Gas is
visible in nondilated left colon.  Imaging features could be
related to a degree of ileus.

## 2012-05-29 ENCOUNTER — Other Ambulatory Visit: Payer: Self-pay | Admitting: Internal Medicine

## 2012-10-29 ENCOUNTER — Encounter (HOSPITAL_COMMUNITY): Payer: Self-pay | Admitting: Emergency Medicine

## 2012-10-29 ENCOUNTER — Emergency Department (HOSPITAL_COMMUNITY)
Admission: EM | Admit: 2012-10-29 | Discharge: 2012-10-30 | Disposition: A | Discharge status: Home / Self Care | Payer: Medicare Other | Attending: Emergency Medicine | Admitting: Emergency Medicine

## 2012-10-29 ENCOUNTER — Emergency Department (HOSPITAL_COMMUNITY): Payer: Medicare Other

## 2012-10-29 DIAGNOSIS — Z8669 Personal history of other diseases of the nervous system and sense organs: Secondary | ICD-10-CM | POA: Insufficient documentation

## 2012-10-29 DIAGNOSIS — Z860100 Personal history of colon polyps, unspecified: Secondary | ICD-10-CM | POA: Insufficient documentation

## 2012-10-29 DIAGNOSIS — E119 Type 2 diabetes mellitus without complications: Secondary | ICD-10-CM | POA: Insufficient documentation

## 2012-10-29 DIAGNOSIS — Z87891 Personal history of nicotine dependence: Secondary | ICD-10-CM | POA: Insufficient documentation

## 2012-10-29 DIAGNOSIS — R042 Hemoptysis: Secondary | ICD-10-CM | POA: Insufficient documentation

## 2012-10-29 DIAGNOSIS — J4489 Other specified chronic obstructive pulmonary disease: Secondary | ICD-10-CM | POA: Insufficient documentation

## 2012-10-29 DIAGNOSIS — I10 Essential (primary) hypertension: Secondary | ICD-10-CM | POA: Insufficient documentation

## 2012-10-29 DIAGNOSIS — Z794 Long term (current) use of insulin: Secondary | ICD-10-CM | POA: Insufficient documentation

## 2012-10-29 DIAGNOSIS — Z7982 Long term (current) use of aspirin: Secondary | ICD-10-CM | POA: Insufficient documentation

## 2012-10-29 DIAGNOSIS — IMO0002 Reserved for concepts with insufficient information to code with codable children: Secondary | ICD-10-CM | POA: Insufficient documentation

## 2012-10-29 DIAGNOSIS — M810 Age-related osteoporosis without current pathological fracture: Secondary | ICD-10-CM | POA: Insufficient documentation

## 2012-10-29 DIAGNOSIS — E039 Hypothyroidism, unspecified: Secondary | ICD-10-CM | POA: Insufficient documentation

## 2012-10-29 DIAGNOSIS — J449 Chronic obstructive pulmonary disease, unspecified: Secondary | ICD-10-CM | POA: Insufficient documentation

## 2012-10-29 DIAGNOSIS — Z86718 Personal history of other venous thrombosis and embolism: Secondary | ICD-10-CM | POA: Insufficient documentation

## 2012-10-29 DIAGNOSIS — Z791 Long term (current) use of non-steroidal anti-inflammatories (NSAID): Secondary | ICD-10-CM | POA: Insufficient documentation

## 2012-10-29 DIAGNOSIS — Z79899 Other long term (current) drug therapy: Secondary | ICD-10-CM | POA: Insufficient documentation

## 2012-10-29 DIAGNOSIS — R51 Headache: Secondary | ICD-10-CM | POA: Insufficient documentation

## 2012-10-29 DIAGNOSIS — M19049 Primary osteoarthritis, unspecified hand: Secondary | ICD-10-CM | POA: Insufficient documentation

## 2012-10-29 LAB — CBC WITH DIFFERENTIAL/PLATELET
Eosinophils Relative: 3 % (ref 0–5)
HCT: 43 % (ref 36.0–46.0)
Hemoglobin: 14.4 g/dL (ref 12.0–15.0)
Lymphocytes Relative: 28 % (ref 12–46)
Lymphs Abs: 2.1 10*3/uL (ref 0.7–4.0)
MCH: 27.9 pg (ref 26.0–34.0)
MCHC: 33.5 g/dL (ref 30.0–36.0)
MCV: 83.3 fL (ref 78.0–100.0)
Monocytes Absolute: 0.8 10*3/uL (ref 0.1–1.0)
Monocytes Relative: 11 % (ref 3–12)
Neutrophils Relative %: 58 % (ref 43–77)
RBC: 5.16 MIL/uL — ABNORMAL HIGH (ref 3.87–5.11)
RDW: 14 % (ref 11.5–15.5)
WBC: 7.5 10*3/uL (ref 4.0–10.5)

## 2012-10-29 LAB — BASIC METABOLIC PANEL
BUN: 24 mg/dL — ABNORMAL HIGH (ref 6–23)
CO2: 25 mEq/L (ref 19–32)
Calcium: 9.7 mg/dL (ref 8.4–10.5)
Creatinine, Ser: 1.05 mg/dL (ref 0.50–1.10)
GFR calc Af Amer: 51 mL/min — ABNORMAL LOW (ref >90)
Glucose, Bld: 216 mg/dL — ABNORMAL HIGH (ref 70–99)
Potassium: 3.7 mEq/L (ref 3.5–5.1)

## 2012-10-29 NOTE — ED Provider Notes (Signed)
CSN: [id number]     Arrival date & time 10/29/12  2145 History   First MD Initiated Contact with Patient 10/29/12 2210     Chief Complaint  Patient presents with  . coughing up blood    (Consider location/radiation/quality/duration/timing/severity/associated sxs/prior Treatment) HPI Comments: 79 yo female presenting after two episodes of coughing/spitting up blood.  She reports waking up this morning and clearing her throat rather forcefully, and then spit up a tablespoon worth of blood into the sink. She had no further problems throughout the day until this evening. She had just finished eating a bag of chips.  When she rinsed her mouth out she spit out another tablespoon worth of bright blood.  She has subsequently had no further problems. She denies chest pain, shortness of breath, fevers, vomiting, nausea, epistaxis.  She does complain of a mild left frontal headache, which she states is typical for her. She attributes these headaches to her sinusitis and frequently takes ibuprofen for them.   Past Medical History  Diagnosis Date  . CATARACT EXTRACTION, HX OF 09/21/2006  . COLONIC POLYPS, HX OF 09/21/2006  . COPD 09/21/2006  . DIABETES MELLITUS 09/21/2006  . DVT 09/21/2006  . Headache(784.0) 06/13/2007  . HYPERLIPIDEMIA 09/21/2006  . HYPERTENSION 09/21/2006  . HYPOTHYROIDISM 09/21/2006  . HYSTERECTOMY, HX OF 09/21/2006  . NEURALGIA, TRIGEMINAL 10/01/2008  . OSTEOPOROSIS 09/21/2006  . PLANTAR FASCIITIS 09/21/2006  . PRIMARY LOCALIZED OSTEOARTHROSIS HAND 06/13/2007  . THYROIDECTOMY, HX OF 09/21/2006   Past Surgical History  Procedure Laterality Date  . Abdominal hysterectomy    . Cataract extraction    . Thyroidectomy     History reviewed. No pertinent family history. History  Substance Use Topics  . Smoking status: Former Smoker    Quit date: 01/14/1979  . Smokeless tobacco: Never Used  . Alcohol Use: No   OB History   Grav Para Term Preterm Abortions TAB SAB Ect Mult Living                  Review of Systems  Constitutional: Negative for fever.  HENT: Negative for congestion.   Respiratory: Negative for cough and shortness of breath.   Cardiovascular: Negative for chest pain.  Gastrointestinal: Negative for nausea, vomiting, abdominal pain and diarrhea.  Neurological: Positive for headaches.  All other systems reviewed and are negative.    Allergies  Codeine  Home Medications   Current Outpatient Rx  Name  Route  Sig  Dispense  Refill  . amitriptyline (ELAVIL) 25 MG tablet   Oral   Take 1 tablet (25 mg total) by mouth at bedtime.   30 tablet   11   . amLODipine (NORVASC) 5 MG tablet   Oral   Take 1 tablet (5 mg total) by mouth daily.   30 tablet   11   . aspirin 81 MG tablet   Oral   Take 81 mg by mouth 3 (three) times daily.          . calcium-vitamin D (OSCAL WITH D) 500-200 MG-UNIT per tablet   Oral   Take 1 tablet by mouth daily.           . diclofenac sodium (VOLTAREN) 1 % GEL   Topical   Apply 2 g topically 4 (four) times daily.   100 g   3   . fish oil-omega-3 fatty acids 1000 MG capsule   Oral   Take 1 g by mouth daily.           Marland Kitchen  Fluticasone-Salmeterol (ADVAIR DISKUS) 250-50 MCG/DOSE AEPB   Inhalation   Inhale 1 puff into the lungs 2 (two) times daily.   60 each   12   . furosemide (LASIX) 40 MG tablet   Oral   Take 1 tablet (40 mg total) by mouth daily.   30 tablet   11   . HUMULIN 70/30 (70-30) 100 UNIT/ML injection      inject 35 units every morning and 20 units IN THE EVENING   10 mL   11   . levothyroxine (SYNTHROID, LEVOTHROID) 100 MCG tablet   Oral   Take 1 tablet (100 mcg total) by mouth daily.   30 tablet   11   . losartan (COZAAR) 50 MG tablet   Oral   Take 1 tablet (50 mg total) by mouth daily.   90 tablet   3   . Multiple Vitamins-Minerals (MULTIVITAMIN WITH MINERALS) tablet   Oral   Take 1 tablet by mouth daily.           . Probiotic Product (ALIGN) 4 MG CAPS   Oral   Take 1 capsule by  mouth daily.         . valsartan (DIOVAN) 160 MG tablet   Oral   Take 1 tablet (160 mg total) by mouth daily.   30 tablet   11   . vitamin E 400 UNIT capsule   Oral   Take 400 Units by mouth daily.            BP 201/68  Pulse 105  Temp(Src) 98.5 F (36.9 C) (Oral)  Resp 16  SpO2 96% Physical Exam  Nursing note and vitals reviewed. Constitutional: She is oriented to person, place, and time. She appears well-developed and well-nourished. No distress.  HENT:  Head: Normocephalic and atraumatic.  Mouth/Throat: Oropharynx is clear and moist.  No signs of trauma or bleeding to her oropharynx or nose.  Eyes: Conjunctivae are normal. Pupils are equal, round, and reactive to light. No scleral icterus.  Neck: Neck supple.  Cardiovascular: Normal rate, regular rhythm, normal heart sounds and intact distal pulses.   No murmur heard. Pulmonary/Chest: Effort normal and breath sounds normal. No stridor. No respiratory distress. She has no wheezes. She has no rales.  Abdominal: Soft. Bowel sounds are normal. She exhibits no distension. There is no tenderness.  Musculoskeletal: Normal range of motion.  Neurological: She is alert and oriented to person, place, and time.  Skin: Skin is warm and dry. No rash noted.  Psychiatric: She has a normal mood and affect. Her behavior is normal.    ED Course  Procedures (including critical care time) Labs Review Labs Reviewed  CBC WITH DIFFERENTIAL - Abnormal; Notable for the following:    RBC 5.16 (*)    All other components within normal limits  BASIC METABOLIC PANEL - Abnormal; Notable for the following:    Glucose, Bld 216 (*)    BUN 24 (*)    GFR calc non Af Amer 44 (*)    GFR calc Af Amer 51 (*)    All other components within normal limits   Imaging Review Dg Chest 2 View  10/30/2012   CLINICAL DATA:  Hemoptysis  EXAM: CHEST  2 VIEW  COMPARISON:  01/19/2012  FINDINGS: Hyperinflated lungs with interstitial coarsening and  architectural distortion. There is a new peripheral nodular density, approximately 1 cm, in the peripheral left upper chest. Faint ovoid opacities in the right lower lung are new from prior.  Chronic volume loss and opacification in the right middle lobe. No edema or effusion. No pneumothorax. Stable, prominent heart size. Aortic atherosclerosis.  IMPRESSION: 1. Nonspecific nodular densities in the left upper and right lower chest, new since 01/19/2012. These may represent small foci of infection or inflammation. Short followup study recommended to ensure clearing, and exclude neoplastic disease. 2. COPD. 3. Chronic right middle lobe scarring.   Electronically Signed   By: Tiburcio Pea M.D.   On: 10/30/2012 00:02  All radiology studies independently viewed by me.     EKG Interpretation     Ventricular Rate:  91 PR Interval:  188 QRS Duration: 91 QT Interval:  428 QTC Calculation: 527 R Axis:   -43 Text Interpretation:  Sinus rhythm Left axis deviation RSR' in V1 or V2, probably normal variant Borderline repolarization abnormality Prolonged QT interval nonspecific ST and T wave changes Baseline wander in lead(s) I II aVR V1 No old tracing to compare            MDM   1. Cough with hemoptysis    Well appearing 79 yo female presenting after two episodes of cough and/or spitting up blood.  She denied any respiratory complaints, fevers, blood in stool, nausea, or vomiting.  She was initially hypertensive, but her blood pressures improved without intervention  She had no symptoms of end organ damage.  Her workup revealed nonspecific ekg changes and pulmonary nodules.  As regards her EKG changes, review of an EKG report from 2011 commented on similar changes (although I cannot visualize the image personally).  As she has no chest pain, shortness of breath, nausea, diaphoresis, or dizziness, I don't think think these are acute changes.  As regards her pulmonary nodules, we had a long discussion  about the pro's and con's of obtaining contrasted CT imaging (due to her age and renal insufficiency.)  We had shared decision making to forego further imaging in the ED.  As her respiratory status has remained stable and as she had only mild hemoptysis, I feel that she is appropriate for outpatient management.  I think her bleeding is most likely from an upper respiratory source, given her lack of respiratory symptoms and because she was not coughing at time of second episode (she was rinsing her mouth after eating chips).  She lives with her son, who will bring her back to the ED if her symptoms worsen or if new symptoms develop.      Candyce Churn, MD 10/30/12 [phone]

## 2012-10-29 NOTE — ED Notes (Signed)
Pt has chronic cough for past 40 yrs. Today pt has had 2 episodes of coughing up a large amount of bright red blood, "filled the bottom of the sink." Pt denies difficulty breathing, chest pain, nausea, or other sx. Pt's only current complaint is an anterior bilateral headache, "feels like sinus pressure." Pt takes 240 of aspirin daily.

## 2012-10-30 NOTE — ED Notes (Signed)
Pt coughed up a dime-sized amount of blood while in bathroom.

## 2012-10-31 ENCOUNTER — Encounter: Payer: Self-pay | Admitting: Internal Medicine

## 2012-10-31 ENCOUNTER — Ambulatory Visit (INDEPENDENT_AMBULATORY_CARE_PROVIDER_SITE_OTHER): Payer: Medicare Other | Admitting: Internal Medicine

## 2012-10-31 VITALS — BP 180/72 | HR 89 | Temp 98.5°F | Wt 135.0 lb

## 2012-10-31 DIAGNOSIS — R918 Other nonspecific abnormal finding of lung field: Secondary | ICD-10-CM

## 2012-10-31 MED ORDER — AMOXICILLIN 875 MG PO TABS: 875.0000 mg | ORAL_TABLET | Freq: Two times a day (BID) | ORAL | Status: DC

## 2012-10-31 MED ORDER — AMLODIPINE BESYLATE 10 MG PO TABS: 10.0000 mg | ORAL_TABLET | Freq: Every day | ORAL | Status: DC

## 2012-10-31 NOTE — Patient Instructions (Signed)
1. PUlmonary nodules with hemoptysis (coughing up frank blood) - worrisome Plan CT chest without contrast to better evaluate the nodules  If this is suspicious the next step is a PET scan.  2. Sinus tenderness - even though you have no fever and the blood counts were normal you have signs of a possible sinus infection. Plan  Amoxicillin 875 mg twice a day for 7 days  3. Tender stomach - you are tender on exam when I push. Plan Treat with over the counter Prilosec 20 mg once every AM before breakfast  4. Blood pressure - running a little high Plan Continue furosemide and losartan  Increase amlodipine to 10 mg once a day (2 x 5 mg until gone.)

## 2012-10-31 NOTE — Progress Notes (Signed)
  Subjective:    Patient ID: [name], female    DOB: 1917/01/20, 79 y.o.   MRN: [id number]  HPI [name] presents for ED follow up after an evaluation for hemoptysis - coughed up frank blood on two occasions, verified by her son that this was pure blood w/o mucus. ED eval was negative - normal CBC, Bmet. CXR - two nodules noted that were new: LUL and RML with LUL being larger and more distinct. She has had no weight loss, no change in respiratory function. She has had sinus tenderness and some Substernal discomfort.    Review of Systems     Objective:   Physical Exam Filed Vitals:   10/31/12 1613  BP: 180/72  Pulse: 89  Temp: 98.5 F (36.9 C)   Wt Readings from Last 3 Encounters:  10/31/12 135 lb (61.236 kg)  01/19/12 137 lb (62.143 kg)  09/07/11 138 lb (62.596 kg)   BP Readings from Last 3 Encounters:  10/31/12 180/72  10/30/12 167/58  01/19/12 144/68   Gen'l -WNWD woman looking younger than her age HEENT- tender to gentle percussion over the facial sinuses Cor- RRR Pulm - normal BS, CTAP Abd- tender in the epigastrium Neuro - Alert and oriented.       Assessment & Plan:  Sinusitis - facial tenderness and concern for possible infection Plan Amoxicillin 875 mg bid x 7 days  Supportive care.

## 2012-11-01 DIAGNOSIS — R918 Other nonspecific abnormal finding of lung field: Secondary | ICD-10-CM | POA: Insufficient documentation

## 2012-11-01 NOTE — Assessment & Plan Note (Signed)
Patient with 2 episodes of frank hemoptysis. ED w/u reveals new pulmonary nodules. The concern is for lung cancer. Reviewed x-ray report and images with pateint  PLan CT chest w/o contrast  If CT reveals worrisome nodule will proceed with PET scan.  This was fully discussed with the patient in the presence of her 2 sons. She wants to know and is willing to give consideration to treatment if needed.  (greater than 50% of 30 min visit spent on education and counseling)

## 2012-11-02 ENCOUNTER — Ambulatory Visit (INDEPENDENT_AMBULATORY_CARE_PROVIDER_SITE_OTHER)
Admission: RE | Admit: 2012-11-02 | Discharge: 2012-11-02 | Disposition: A | Discharge status: Home / Self Care | Payer: Medicare Other | Source: Ambulatory Visit | Attending: Internal Medicine | Admitting: Internal Medicine

## 2012-11-02 DIAGNOSIS — R918 Other nonspecific abnormal finding of lung field: Secondary | ICD-10-CM

## 2012-11-03 ENCOUNTER — Encounter: Payer: Self-pay | Admitting: Internal Medicine

## 2012-11-17 ENCOUNTER — Other Ambulatory Visit: Payer: Self-pay

## 2012-12-02 ENCOUNTER — Ambulatory Visit
Admission: RE | Admit: 2012-12-02 | Discharge: 2012-12-02 | Disposition: A | Discharge status: Home / Self Care | Payer: Medicare Other | Source: Ambulatory Visit | Attending: Internal Medicine | Admitting: Internal Medicine

## 2012-12-02 DIAGNOSIS — Z1231 Encounter for screening mammogram for malignant neoplasm of breast: Secondary | ICD-10-CM

## 2012-12-21 ENCOUNTER — Encounter: Payer: Self-pay | Admitting: Internal Medicine

## 2012-12-21 ENCOUNTER — Ambulatory Visit (INDEPENDENT_AMBULATORY_CARE_PROVIDER_SITE_OTHER): Payer: Medicare Other | Admitting: Internal Medicine

## 2012-12-21 ENCOUNTER — Ambulatory Visit (INDEPENDENT_AMBULATORY_CARE_PROVIDER_SITE_OTHER)
Admission: RE | Admit: 2012-12-21 | Discharge: 2012-12-21 | Disposition: A | Discharge status: Home / Self Care | Payer: Medicare Other | Source: Ambulatory Visit | Attending: Internal Medicine | Admitting: Internal Medicine

## 2012-12-21 ENCOUNTER — Other Ambulatory Visit: Payer: Self-pay

## 2012-12-21 VITALS — BP 138/54 | HR 88 | Temp 97.7°F | Wt 140.6 lb

## 2012-12-21 DIAGNOSIS — J209 Acute bronchitis, unspecified: Secondary | ICD-10-CM

## 2012-12-21 DIAGNOSIS — R918 Other nonspecific abnormal finding of lung field: Secondary | ICD-10-CM

## 2012-12-21 MED ORDER — ALBUTEROL SULFATE HFA 108 (90 BASE) MCG/ACT IN AERS: 2.0000 | INHALATION_SPRAY | Freq: Four times a day (QID) | RESPIRATORY_TRACT | Status: DC

## 2012-12-21 MED ORDER — MOXIFLOXACIN HCL 400 MG PO TABS: 400.0000 mg | ORAL_TABLET | Freq: Every day | ORAL | Status: DC

## 2012-12-21 MED ORDER — AEROCHAMBER PLUS FLO-VU MEDIUM MISC: 1.0000 | Freq: Once | Status: DC

## 2012-12-21 MED ORDER — PREDNISONE 10 MG PO TABS: 10.0000 mg | ORAL_TABLET | ORAL | Status: DC

## 2012-12-21 NOTE — Patient Instructions (Signed)
Thank you for finally coming in to see me!  1. Most likely acute bronchitis but cannot be sure this isn't pneumonia.  Plan Chest x-ray today  Avelox 400 mg daiuly x 7 day (antibiotic  Prednisone 10 mg: 4 tablets/day x 3 days; 3 tab/day x 3 days; 2 tab/day x 3 days; 1 tab/sday X 6 days  Albuterol inhaler using an Aerochamber - 2 puffs 4 times a day  Tylenol for fever or aches: 1 or 2 500 mg tablets up to 3 times a day  Robitussin DM 1 tsp every 4 hours for cough  Hydrate  Call if you get worse or have more trouble breathing.  2. Asthma - once you are over this will restart Advair 250/50 1 inhalation AM and bedtime  3. PUlmonary nodules - this may be a change from chronic infections and scarring (granulomatous disease) or a possible lung cancer. To know will need to do a PET scan if you feel you would want to pursue any treatment if it is cancer, i.e. Chemotherapy. You can and need to think about this.

## 2012-12-21 NOTE — Progress Notes (Signed)
Subjective:    Patient ID: [name], female    DOB: 1917/03/21, 79 y.o.   MRN: [id number]  HPI [name] presents for evaluation of a 10 day illness with productive cough, SOB, wheezing, no fever. She has not taken any medication for this illness.   In October she had cough with hemoptysis CXR was suspicious for nodules and follow up CT Oct 31, 2012 with multiple bilateral pulmonary nodules that may represent granulomatous disease vs metastatic disease. PET scan was suggested for further diagnosis.   Breast abnormalities were also seen on CT chest. Follow up Mammogram was normal.  Past Medical History  Diagnosis Date  . CATARACT EXTRACTION, HX OF 09/21/2006  . COLONIC POLYPS, HX OF 09/21/2006  . COPD 09/21/2006  . DIABETES MELLITUS 09/21/2006  . DVT 09/21/2006  . Headache(784.0) 06/13/2007  . HYPERLIPIDEMIA 09/21/2006  . HYPERTENSION 09/21/2006  . HYPOTHYROIDISM 09/21/2006  . HYSTERECTOMY, HX OF 09/21/2006  . NEURALGIA, TRIGEMINAL 10/01/2008  . OSTEOPOROSIS 09/21/2006  . PLANTAR FASCIITIS 09/21/2006  . PRIMARY LOCALIZED OSTEOARTHROSIS HAND 06/13/2007  . THYROIDECTOMY, HX OF 09/21/2006   Past Surgical History  Procedure Laterality Date  . Abdominal hysterectomy    . Cataract extraction    . Thyroidectomy     History reviewed. No pertinent family history. History   Social History  . Marital Status: Widowed    Spouse Name: N/A    Number of Children: 4  . Years of Education: 4th   Occupational History  . retired    Social History Main Topics  . Smoking status: Former Smoker    Quit date: 01/14/1979  . Smokeless tobacco: Never Used  . Alcohol Use: No  . Drug Use: No  . Sexual Activity: No   Other Topics Concern  . Not on file   Social History Narrative   4th grade. Married '44 -'50 yrs/Widowed. 3 sons -'12, '46, '51, ; 2 dtrs ' 47, one stillborn. Work - Theatre manager, [employer], Production assistant, radio. Lives alone, very independent, continues to garden, does all her own housework, avid reader.        End of life care (01/28/09) in a discussion about the recent death sisiter she is very clear that she does not want CPR or intubation or any heroic measures. Will send a packet with out of facility order, "L[employer]'s Guide" most form.    Current Outpatient Prescriptions on File Prior to Visit  Medication Sig Dispense Refill  . amitriptyline (ELAVIL) 25 MG tablet Take 1 tablet (25 mg total) by mouth at bedtime.  30 tablet  11  . amLODipine (NORVASC) 10 MG tablet Take 1 tablet (10 mg total) by mouth daily.  30 tablet  11  . amoxicillin (AMOXIL) 875 MG tablet Take 1 tablet (875 mg total) by mouth 2 (two) times daily.  14 tablet  0  . aspirin 81 MG tablet Take 81 mg by mouth 3 (three) times daily.       . calcium-vitamin D (OSCAL WITH D) 500-200 MG-UNIT per tablet Take 1 tablet by mouth daily.        . diclofenac sodium (VOLTAREN) 1 % GEL Apply 2 g topically 4 (four) times daily.  100 g  3  . fish oil-omega-3 fatty acids 1000 MG capsule Take 1 g by mouth daily.        . Fluticasone-Salmeterol (ADVAIR DISKUS) 250-50 MCG/DOSE AEPB Inhale 1 puff into the lungs 2 (two) times daily.  60 each  12  . furosemide (LASIX) 40 MG  tablet Take 1 tablet (40 mg total) by mouth daily.  30 tablet  11  . HUMULIN 70/30 (70-30) 100 UNIT/ML injection inject 35 units every morning and 20 units IN THE EVENING  10 mL  11  . levothyroxine (SYNTHROID, LEVOTHROID) 100 MCG tablet Take 1 tablet (100 mcg total) by mouth daily.  30 tablet  11  . losartan (COZAAR) 50 MG tablet Take 1 tablet (50 mg total) by mouth daily.  90 tablet  3  . Multiple Vitamins-Minerals (MULTIVITAMIN WITH MINERALS) tablet Take 1 tablet by mouth daily.        . Probiotic Product (ALIGN) 4 MG CAPS Take 1 capsule by mouth daily.      . valsartan (DIOVAN) 160 MG tablet Take 1 tablet (160 mg total) by mouth daily.  30 tablet  11  . vitamin E 400 UNIT capsule Take 400 Units by mouth daily.         No current facility-administered medications on file  prior to visit.       Review of Systems System review is negative for any constitutional, cardiac, pulmonary, GI or neuro symptoms or complaints other than as described in the HPI.     Objective:   Physical Exam Filed Vitals:   12/21/12 1119  BP: 138/54  Pulse: 88  Temp: 97.7 F (36.5 C)   O2 sat 96%  Gen'l- pleasant 79 y/o woman looking younger than her stated age HEENT_ normal Neck -supple Cor 2+ radial, RRR Pulm - increased WOB, wet cough, decreased breath sounds with diffuse wheezing. Neuro - A&O x 3 normal strength, normal gait        Assessment & Plan:  acute bronchitis but cannot be sure this isn't pneumonia.  Plan Chest x-ray today*  Avelox 400 mg daiuly x 7 day (antibiotic  Prednisone 10 mg: 4 tablets/day x 3 days; 3 tab/day x 3 days; 2 tab/day x 3 days; 1 tab/sday X 6 days  Albuterol inhaler using an Aerochamber - 2 puffs 4 times a day  Tylenol for fever or aches: 1 or 2 500 mg tablets up to 3 times a day  Robitussin DM 1 tsp every 4 hours for cough  Hydrate  Call if you get worse or have more trouble breathing.  CXR *MPRESSION:  1. There is hyperinflation consistent with COPD. Mildly increased  interstitial markings are stable and likely reflect pulmonary  fibrosis.  2. There is no evidence of CHF. There is no pleural nor pericardial  effusion.  3. An area of sclerosis projecting over the lateral aspect of the  left 5th rib is demonstrated and does not appear new. It may be  related to pleural based density demonstrated here on the previous  CT scan.

## 2012-12-21 NOTE — Progress Notes (Signed)
Pre visit review using our clinic review tool, if applicable. No additional management support is needed unless otherwise documented below in the visit note. 

## 2012-12-22 NOTE — Assessment & Plan Note (Signed)
Reviewed with Patient and her son. She did have a f/u mammogram that was normal. Discussed the CT findings and the possibility of chronic granulomatous disease vs. Metastatic lung cancer. Discussed the steps in evaluation and possible treatment. Her age was included in the discussion as an important factor in regard to any treatment. At this time she has decided to not pursue further evaluation in that she does not want any treatment in the event of a malignancy. Her son supports her decision.

## 2013-01-17 ENCOUNTER — Encounter (HOSPITAL_COMMUNITY): Payer: Self-pay | Admitting: Emergency Medicine

## 2013-01-17 ENCOUNTER — Encounter: Payer: Self-pay | Admitting: Internal Medicine

## 2013-01-17 ENCOUNTER — Emergency Department (HOSPITAL_COMMUNITY): Payer: Medicare Other

## 2013-01-17 ENCOUNTER — Inpatient Hospital Stay (HOSPITAL_COMMUNITY)
Admission: EM | Admit: 2013-01-17 | Discharge: 2013-01-21 | DRG: 189 | Disposition: A | Discharge status: Home / Self Care | Payer: Medicare Other | Attending: Family Medicine | Admitting: Family Medicine

## 2013-01-17 ENCOUNTER — Ambulatory Visit (INDEPENDENT_AMBULATORY_CARE_PROVIDER_SITE_OTHER): Payer: Medicare Other | Admitting: Internal Medicine

## 2013-01-17 VITALS — BP 144/60 | HR 95 | Temp 97.2°F | Wt 133.0 lb

## 2013-01-17 DIAGNOSIS — R918 Other nonspecific abnormal finding of lung field: Secondary | ICD-10-CM | POA: Diagnosis present

## 2013-01-17 DIAGNOSIS — R51 Headache: Secondary | ICD-10-CM

## 2013-01-17 DIAGNOSIS — IMO0002 Reserved for concepts with insufficient information to code with codable children: Secondary | ICD-10-CM

## 2013-01-17 DIAGNOSIS — Z0000 Encounter for general adult medical examination without abnormal findings: Secondary | ICD-10-CM

## 2013-01-17 DIAGNOSIS — Z860100 Personal history of colon polyps, unspecified: Secondary | ICD-10-CM

## 2013-01-17 DIAGNOSIS — M722 Plantar fascial fibromatosis: Secondary | ICD-10-CM

## 2013-01-17 DIAGNOSIS — G500 Trigeminal neuralgia: Secondary | ICD-10-CM

## 2013-01-17 DIAGNOSIS — J441 Chronic obstructive pulmonary disease with (acute) exacerbation: Secondary | ICD-10-CM | POA: Diagnosis present

## 2013-01-17 DIAGNOSIS — E119 Type 2 diabetes mellitus without complications: Secondary | ICD-10-CM | POA: Diagnosis present

## 2013-01-17 DIAGNOSIS — J449 Chronic obstructive pulmonary disease, unspecified: Secondary | ICD-10-CM

## 2013-01-17 DIAGNOSIS — E1159 Type 2 diabetes mellitus with other circulatory complications: Secondary | ICD-10-CM | POA: Diagnosis present

## 2013-01-17 DIAGNOSIS — Z66 Do not resuscitate: Secondary | ICD-10-CM | POA: Diagnosis present

## 2013-01-17 DIAGNOSIS — E876 Hypokalemia: Secondary | ICD-10-CM | POA: Diagnosis present

## 2013-01-17 DIAGNOSIS — Z794 Long term (current) use of insulin: Secondary | ICD-10-CM

## 2013-01-17 DIAGNOSIS — R0603 Acute respiratory distress: Secondary | ICD-10-CM

## 2013-01-17 DIAGNOSIS — Z9079 Acquired absence of other genital organ(s): Secondary | ICD-10-CM

## 2013-01-17 DIAGNOSIS — J209 Acute bronchitis, unspecified: Secondary | ICD-10-CM

## 2013-01-17 DIAGNOSIS — J9601 Acute respiratory failure with hypoxia: Secondary | ICD-10-CM

## 2013-01-17 DIAGNOSIS — M47816 Spondylosis without myelopathy or radiculopathy, lumbar region: Secondary | ICD-10-CM

## 2013-01-17 DIAGNOSIS — R0609 Other forms of dyspnea: Secondary | ICD-10-CM

## 2013-01-17 DIAGNOSIS — Z9089 Acquired absence of other organs: Secondary | ICD-10-CM

## 2013-01-17 DIAGNOSIS — R0989 Other specified symptoms and signs involving the circulatory and respiratory systems: Secondary | ICD-10-CM

## 2013-01-17 DIAGNOSIS — I10 Essential (primary) hypertension: Secondary | ICD-10-CM | POA: Diagnosis present

## 2013-01-17 DIAGNOSIS — M810 Age-related osteoporosis without current pathological fracture: Secondary | ICD-10-CM

## 2013-01-17 DIAGNOSIS — B9789 Other viral agents as the cause of diseases classified elsewhere: Secondary | ICD-10-CM

## 2013-01-17 DIAGNOSIS — E039 Hypothyroidism, unspecified: Secondary | ICD-10-CM | POA: Diagnosis present

## 2013-01-17 DIAGNOSIS — J069 Acute upper respiratory infection, unspecified: Secondary | ICD-10-CM | POA: Diagnosis present

## 2013-01-17 DIAGNOSIS — I82409 Acute embolism and thrombosis of unspecified deep veins of unspecified lower extremity: Secondary | ICD-10-CM

## 2013-01-17 DIAGNOSIS — Z87891 Personal history of nicotine dependence: Secondary | ICD-10-CM

## 2013-01-17 DIAGNOSIS — Z79899 Other long term (current) drug therapy: Secondary | ICD-10-CM

## 2013-01-17 DIAGNOSIS — E785 Hyperlipidemia, unspecified: Secondary | ICD-10-CM | POA: Diagnosis present

## 2013-01-17 DIAGNOSIS — J9600 Acute respiratory failure, unspecified whether with hypoxia or hypercapnia: Principal | ICD-10-CM | POA: Diagnosis present

## 2013-01-17 DIAGNOSIS — J45901 Unspecified asthma with (acute) exacerbation: Secondary | ICD-10-CM

## 2013-01-17 DIAGNOSIS — B349 Viral infection, unspecified: Secondary | ICD-10-CM

## 2013-01-17 DIAGNOSIS — E871 Hypo-osmolality and hyponatremia: Secondary | ICD-10-CM | POA: Diagnosis present

## 2013-01-17 LAB — BLOOD GAS, ARTERIAL
Acid-Base Excess: 2 mmol/L (ref 0.0–2.0)
Bicarbonate: 24.8 mEq/L — ABNORMAL HIGH (ref 20.0–24.0)
DRAWN BY: 235321
O2 CONTENT: 2 L/min
O2 Saturation: 94 %
PCO2 ART: 34.5 mmHg — AB (ref 35.0–45.0)
Patient temperature: 98.6
TCO2: 21.4 mmol/L (ref 0–100)
pH, Arterial: 7.47 — ABNORMAL HIGH (ref 7.350–7.450)
pO2, Arterial: 65.7 mmHg — ABNORMAL LOW (ref 80.0–100.0)

## 2013-01-17 LAB — GLUCOSE, CAPILLARY
Glucose-Capillary: 143 mg/dL — ABNORMAL HIGH (ref 70–99)
Glucose-Capillary: 171 mg/dL — ABNORMAL HIGH (ref 70–99)
Glucose-Capillary: 219 mg/dL — ABNORMAL HIGH (ref 70–99)

## 2013-01-17 LAB — BASIC METABOLIC PANEL
BUN: 21 mg/dL (ref 6–23)
CALCIUM: 9.1 mg/dL (ref 8.4–10.5)
CHLORIDE: 95 meq/L — AB (ref 96–112)
CO2: 22 meq/L (ref 19–32)
Creatinine, Ser: 1.22 mg/dL — ABNORMAL HIGH (ref 0.50–1.10)
GFR calc Af Amer: 43 mL/min — ABNORMAL LOW (ref >90)
GFR calc non Af Amer: 37 mL/min — ABNORMAL LOW (ref >90)
Glucose, Bld: 262 mg/dL — ABNORMAL HIGH (ref 70–99)
Potassium: 3.6 mEq/L — ABNORMAL LOW (ref 3.7–5.3)
SODIUM: 134 meq/L — AB (ref 137–147)

## 2013-01-17 LAB — CBC
HEMATOCRIT: 42.4 % (ref 36.0–46.0)
Hemoglobin: 14.4 g/dL (ref 12.0–15.0)
MCH: 28.2 pg (ref 26.0–34.0)
MCHC: 34 g/dL (ref 30.0–36.0)
MCV: 83 fL (ref 78.0–100.0)
PLATELETS: 176 10*3/uL (ref 150–400)
RBC: 5.11 MIL/uL (ref 3.87–5.11)
RDW: 14.5 % (ref 11.5–15.5)
WBC: 9.4 10*3/uL (ref 4.0–10.5)

## 2013-01-17 LAB — POCT I-STAT TROPONIN I: Troponin i, poc: 0.05 ng/mL (ref 0.00–0.08)

## 2013-01-17 MED ORDER — FUROSEMIDE 40 MG PO TABS
40.0000 mg | ORAL_TABLET | Freq: Every day | ORAL | Status: DC
  Administered 2013-01-18 – 2013-01-21 (×4): 40 mg via ORAL
  Filled 2013-01-17 (×4): qty 1

## 2013-01-17 MED ORDER — ONDANSETRON HCL 4 MG/2ML IJ SOLN: 4.0000 mg | Freq: Four times a day (QID) | INTRAMUSCULAR | Status: DC | PRN

## 2013-01-17 MED ORDER — SODIUM CHLORIDE 0.9 % IJ SOLN
3.0000 mL | Freq: Two times a day (BID) | INTRAMUSCULAR | Status: DC
  Administered 2013-01-19 – 2013-01-21 (×4): 3 mL via INTRAVENOUS

## 2013-01-17 MED ORDER — LEVOTHYROXINE SODIUM 100 MCG PO TABS
100.0000 ug | ORAL_TABLET | Freq: Every day | ORAL | Status: DC
  Administered 2013-01-18 – 2013-01-21 (×4): 100 ug via ORAL
  Filled 2013-01-17 (×5): qty 1

## 2013-01-17 MED ORDER — SODIUM CHLORIDE 0.9 % IV SOLN: 250.0000 mL | INTRAVENOUS | Status: DC | PRN

## 2013-01-17 MED ORDER — IPRATROPIUM-ALBUTEROL 0.5-2.5 (3) MG/3ML IN SOLN
3.0000 mL | Freq: Four times a day (QID) | RESPIRATORY_TRACT | Status: DC
  Administered 2013-01-17 – 2013-01-21 (×14): 3 mL via RESPIRATORY_TRACT
  Filled 2013-01-17 (×43): qty 3

## 2013-01-17 MED ORDER — IPRATROPIUM-ALBUTEROL 0.5-2.5 (3) MG/3ML IN SOLN
3.0000 mL | RESPIRATORY_TRACT | Status: DC
  Administered 2013-01-17 (×2): 3 mL via RESPIRATORY_TRACT
  Filled 2013-01-17 (×2): qty 3

## 2013-01-17 MED ORDER — METHYLPREDNISOLONE SODIUM SUCC 125 MG IJ SOLR
125.0000 mg | Freq: Once | INTRAMUSCULAR | Status: AC
  Administered 2013-01-17: 125 mg via INTRAVENOUS
  Filled 2013-01-17: qty 2

## 2013-01-17 MED ORDER — GUAIFENESIN-DM 100-10 MG/5ML PO SYRP: 5.0000 mL | ORAL_SOLUTION | ORAL | Status: DC | PRN

## 2013-01-17 MED ORDER — INSULIN ASPART 100 UNIT/ML ~~LOC~~ SOLN
0.0000 [IU] | Freq: Three times a day (TID) | SUBCUTANEOUS | Status: DC
Start: 2013-01-18 — End: 2013-01-19
  Administered 2013-01-18 (×2): 9 [IU] via SUBCUTANEOUS
  Administered 2013-01-18 – 2013-01-19 (×2): 7 [IU] via SUBCUTANEOUS
  Administered 2013-01-19: 12:00:00 9 [IU] via SUBCUTANEOUS

## 2013-01-17 MED ORDER — IPRATROPIUM BROMIDE 0.02 % IN SOLN: 0.5000 mg | Freq: Four times a day (QID) | RESPIRATORY_TRACT | Status: DC

## 2013-01-17 MED ORDER — DOXYCYCLINE HYCLATE 100 MG PO TABS
100.0000 mg | ORAL_TABLET | Freq: Two times a day (BID) | ORAL | Status: DC
  Administered 2013-01-17 – 2013-01-21 (×8): 100 mg via ORAL
  Filled 2013-01-17 (×9): qty 1

## 2013-01-17 MED ORDER — OPTICHAMBER ADVANTAGE MISC: 1.0000 | Freq: Once | Status: DC

## 2013-01-17 MED ORDER — CALCIUM CARBONATE-VITAMIN D 500-200 MG-UNIT PO TABS
1.0000 | ORAL_TABLET | Freq: Every day | ORAL | Status: DC
  Administered 2013-01-18 – 2013-01-21 (×4): 1 via ORAL
  Filled 2013-01-17 (×4): qty 1

## 2013-01-17 MED ORDER — ENOXAPARIN SODIUM 30 MG/0.3ML ~~LOC~~ SOLN
30.0000 mg | Freq: Every day | SUBCUTANEOUS | Status: DC
  Administered 2013-01-17 – 2013-01-20 (×4): 30 mg via SUBCUTANEOUS
  Filled 2013-01-17 (×5): qty 0.3

## 2013-01-17 MED ORDER — VITAMIN E 180 MG (400 UNIT) PO CAPS
400.0000 [IU] | ORAL_CAPSULE | Freq: Every day | ORAL | Status: DC
  Administered 2013-01-18 – 2013-01-21 (×4): 400 [IU] via ORAL
  Filled 2013-01-17 (×4): qty 1

## 2013-01-17 MED ORDER — ALUM & MAG HYDROXIDE-SIMETH 200-200-20 MG/5ML PO SUSP: 30.0000 mL | Freq: Four times a day (QID) | ORAL | Status: DC | PRN

## 2013-01-17 MED ORDER — LOSARTAN POTASSIUM 50 MG PO TABS
50.0000 mg | ORAL_TABLET | Freq: Every day | ORAL | Status: DC
  Administered 2013-01-18 – 2013-01-21 (×4): 50 mg via ORAL
  Filled 2013-01-17 (×4): qty 1

## 2013-01-17 MED ORDER — ALBUTEROL SULFATE (2.5 MG/3ML) 0.083% IN NEBU: 2.5000 mg | INHALATION_SOLUTION | Freq: Four times a day (QID) | RESPIRATORY_TRACT | Status: DC

## 2013-01-17 MED ORDER — AMITRIPTYLINE HCL 25 MG PO TABS
25.0000 mg | ORAL_TABLET | Freq: Every day | ORAL | Status: DC
  Administered 2013-01-17 – 2013-01-20 (×4): 25 mg via ORAL
  Filled 2013-01-17 (×5): qty 1

## 2013-01-17 MED ORDER — ALBUTEROL SULFATE (2.5 MG/3ML) 0.083% IN NEBU: 2.5000 mg | INHALATION_SOLUTION | RESPIRATORY_TRACT | Status: DC | PRN

## 2013-01-17 MED ORDER — SODIUM CHLORIDE 0.9 % IJ SOLN: 3.0000 mL | INTRAMUSCULAR | Status: DC | PRN

## 2013-01-17 MED ORDER — METHYLPREDNISOLONE SODIUM SUCC 125 MG IJ SOLR
80.0000 mg | Freq: Two times a day (BID) | INTRAMUSCULAR | Status: DC
  Administered 2013-01-18 – 2013-01-21 (×7): 80 mg via INTRAVENOUS
  Filled 2013-01-17 (×9): qty 1.28

## 2013-01-17 MED ORDER — ONDANSETRON HCL 4 MG PO TABS: 4.0000 mg | ORAL_TABLET | Freq: Four times a day (QID) | ORAL | Status: DC | PRN

## 2013-01-17 MED ORDER — DOXYCYCLINE HYCLATE 100 MG PO TABS
100.0000 mg | ORAL_TABLET | Freq: Once | ORAL | Status: AC
  Administered 2013-01-17: 100 mg via ORAL
  Filled 2013-01-17: qty 1

## 2013-01-17 MED ORDER — AMLODIPINE BESYLATE 10 MG PO TABS
10.0000 mg | ORAL_TABLET | Freq: Every day | ORAL | Status: DC
  Administered 2013-01-18 – 2013-01-20 (×3): 10 mg via ORAL
  Filled 2013-01-17 (×3): qty 1

## 2013-01-17 MED ORDER — SODIUM CHLORIDE 0.9 % IJ SOLN
3.0000 mL | Freq: Two times a day (BID) | INTRAMUSCULAR | Status: DC
  Administered 2013-01-19: 10:00:00 3 mL via INTRAVENOUS

## 2013-01-17 NOTE — H&P (Signed)
PCP:   Illene Regulus, MD   Chief Complaint:  sob  HPI: 80 yo female with life long asthma comes in with 7 days of uri, cough, worening sob and wheezing. She went to her pcp today who sent her here for evaluation.  No fevers.  No  Le edema or swelling.  She only has an alb inhaler at home, no nebulizer, and this has not been helping her.  She is on a taper of steroids several times a year, last taper right before christmas.  She was around many of her grandkids during the holidays who were sick with cough, uri symptoms.  No recent abx.  Her breathing is better since arrival but still wheezing and sob.  She lives at home with her son who is present.  Review of Systems:  Positive and negative as per HPI otherwise all other systems are negative  Past Medical History: Past Medical History  Diagnosis Date  . CATARACT EXTRACTION, HX OF 09/21/2006  . COLONIC POLYPS, HX OF 09/21/2006  . COPD 09/21/2006  . DIABETES MELLITUS 09/21/2006  . DVT 09/21/2006  . Headache(784.0) 06/13/2007  . HYPERLIPIDEMIA 09/21/2006  . HYPERTENSION 09/21/2006  . HYPOTHYROIDISM 09/21/2006  . HYSTERECTOMY, HX OF 09/21/2006  . NEURALGIA, TRIGEMINAL 10/01/2008  . OSTEOPOROSIS 09/21/2006  . PLANTAR FASCIITIS 09/21/2006  . PRIMARY LOCALIZED OSTEOARTHROSIS HAND 06/13/2007  . THYROIDECTOMY, HX OF 09/21/2006   Past Surgical History  Procedure Laterality Date  . Abdominal hysterectomy    . Cataract extraction    . Thyroidectomy      Medications: Prior to Admission medications   Medication Sig Start Date End Date Taking? Authorizing Provider  albuterol (PROVENTIL HFA;VENTOLIN HFA) 108 (90 BASE) MCG/ACT inhaler Inhale 2 puffs into the lungs 4 (four) times daily. 12/21/12  Yes Jacques Navy, MD  amitriptyline (ELAVIL) 25 MG tablet Take 1 tablet (25 mg total) by mouth at bedtime. 01/25/12  Yes Jacques Navy, MD  amLODipine (NORVASC) 10 MG tablet Take 1 tablet (10 mg total) by mouth daily. 10/31/12  Yes Jacques Navy, MD  aspirin 81 MG  tablet Take 81 mg by mouth 3 (three) times daily.    Yes Historical Provider, MD  calcium-vitamin D (OSCAL WITH D) 500-200 MG-UNIT per tablet Take 1 tablet by mouth daily.     Yes Historical Provider, MD  diclofenac sodium (VOLTAREN) 1 % GEL Apply 2 g topically 4 (four) times daily. 09/07/11  Yes Jacques Navy, MD  fish oil-omega-3 fatty acids 1000 MG capsule Take 1 g by mouth daily.     Yes Historical Provider, MD  Fluticasone-Salmeterol (ADVAIR DISKUS) 250-50 MCG/DOSE AEPB Inhale 1 puff into the lungs 2 (two) times daily. 01/19/11  Yes Jacques Navy, MD  furosemide (LASIX) 40 MG tablet Take 1 tablet (40 mg total) by mouth daily. 01/25/12  Yes Jacques Navy, MD  HUMULIN 70/30 (70-30) 100 UNIT/ML injection inject 35 units every morning and 20 units IN THE EVENING 05/29/12  Yes Jacques Navy, MD  levothyroxine (SYNTHROID, LEVOTHROID) 100 MCG tablet Take 1 tablet (100 mcg total) by mouth daily. 01/25/12  Yes Jacques Navy, MD  losartan (COZAAR) 50 MG tablet Take 1 tablet (50 mg total) by mouth daily. 02/11/12  Yes Jacques Navy, MD  moxifloxacin (AVELOX) 400 MG tablet Take 1 tablet (400 mg total) by mouth daily. 12/21/12  Yes Jacques Navy, MD  Multiple Vitamins-Minerals (MULTIVITAMIN WITH MINERALS) tablet Take 1 tablet by mouth daily.  Yes Historical Provider, MD  predniSONE (DELTASONE) 10 MG tablet Take 1 tablet (10 mg total) by mouth as directed. See AVS 12/21/12  Yes Jacques NavyMichael E Norins, MD  Probiotic Product (ALIGN) 4 MG CAPS Take 1 capsule by mouth daily.   Yes Historical Provider, MD  Spacer/Aero-Holding Chambers (AEROCHAMBER PLUS FLO-VU MEDIUM) MISC 1 each by Other route once. 12/21/12  Yes Jacques NavyMichael E Norins, MD  valsartan (DIOVAN) 160 MG tablet Take 1 tablet (160 mg total) by mouth daily. 01/25/12  Yes Jacques NavyMichael E Norins, MD  vitamin E 400 UNIT capsule Take 400 Units by mouth daily.     Yes Historical Provider, MD    Allergies:   Allergies  Allergen Reactions  . Codeine Nausea  And Vomiting    Social History:  reports that she quit smoking about 34 years ago. She has never used smokeless tobacco. She reports that she does not drink alcohol or use illicit drugs.  Family History: none  Physical Exam: Filed Vitals:   01/17/13 1351 01/17/13 1352 01/17/13 1645 01/17/13 1756  BP: 143/74     Pulse: 102     Temp: 98 F (36.7 C)     TempSrc: Oral     Resp: 24     SpO2: 93% 94% 89% 85%   General appearance: alert, cooperative and mild distress Head: Normocephalic, without obvious abnormality, atraumatic Eyes: negative Nose: Nares normal. Septum midline. Mucosa normal. No drainage or sinus tenderness. Neck: no JVD and supple, symmetrical, trachea midline Lungs: wheezes bilaterally Heart: regular rate and rhythm, S1, S2 normal, no murmur, click, rub or gallop Abdomen: soft, non-tender; bowel sounds normal; no masses,  no organomegaly Extremities: extremities normal, atraumatic, no cyanosis or edema Pulses: 2+ and symmetric Skin: Skin color, texture, turgor normal. No rashes or lesions Neurologic: Grossly normal    Labs on Admission:   Recent Labs  01/17/13 1420  NA 134*  K 3.6*  CL 95*  CO2 22  GLUCOSE 262*  BUN 21  CREATININE 1.22*  CALCIUM 9.1    Recent Labs  01/17/13 1420  WBC 9.4  HGB 14.4  HCT 42.4  MCV 83.0  PLT 176   Radiological Exams on Admission: Dg Chest 2 View (if Patient Has Fever And/or Copd)  01/17/2013   CLINICAL DATA:  Shortness of breath, cough and COPD.  EXAM: CHEST - 2 VIEW  COMPARISON:  12/21/2012  FINDINGS: Stable COPD and bibasilar scarring. No focal consolidation, edema or pneumothorax is identified. The heart size and mediastinal contours are stable and within normal limits. Stable osteopenia and mild degenerative changes of the spine.  IMPRESSION: Stable COPD.   Electronically Signed   By: Irish LackGlenn  Yamagata M.D.   On: 01/17/2013 14:19    Assessment/Plan  80 yo female with uri past week with asthma exacerbation and  hypoxic resp failure  Principal Problem:   Acute respiratory failure with hypoxia-  Due to uri with copde, place on iv solumedrol.  cxr is neg, place on doxycycline.  freq nebs and oxygen supplementation.  Active Problems:   DIABETES MELLITUS  ssi   HYPERTENSION  Home meds   COPD   COPD exacerbation   Acute viral syndrome  Pt is DNR.  Does not ever wish intubation or cpr in the future.  She should improve over the next several days with above treatment.  Admit to tele monitoring.  Shandricka Monroy A 01/17/2013, 8:46 PM

## 2013-01-17 NOTE — Progress Notes (Signed)
Pre visit review using our clinic review tool, if applicable. No additional management support is needed unless otherwise documented below in the visit note. 

## 2013-01-17 NOTE — ED Notes (Signed)
Pts SP02 down to 85%, RN made aware

## 2013-01-17 NOTE — ED Provider Notes (Signed)
CSN: [id number]     Arrival date & time 01/17/13  1346 History   First MD Initiated Contact with Patient 01/17/13 1457     Chief Complaint  Patient presents with  . Cough  . Shortness of Breath   (Consider location/radiation/quality/duration/timing/severity/associated sxs/prior Treatment) Patient is a 80 y.o. female presenting with shortness of breath. The history is provided by the patient and a relative. No language interpreter was used.  Shortness of Breath Severity:  Moderate Onset quality:  Gradual Duration:  10 days Timing:  Constant Progression:  Partially resolved Chronicity:  Recurrent Relieved by:  Rest and inhaler Worsened by:  Exertion and movement Ineffective treatments:  None tried Associated symptoms: cough and wheezing   Associated symptoms: no abdominal pain, no chest pain, no diaphoresis, no fever, no headaches, no hemoptysis, no neck pain, no sore throat, no sputum production, no syncope and no vomiting   Cough:    Cough characteristics:  Non-productive   Severity:  Moderate   Onset quality:  Gradual   Duration:  10 days   Timing:  Constant   Progression:  Partially resolved   Chronicity:  New Wheezing:    Severity:  Moderate   Onset quality:  Gradual   Duration:  10 days   Timing:  Constant   Progression:  Partially resolved   Chronicity:  Recurrent Risk factors: no hx of cancer, no hx of PE/DVT, no recent surgery and no tobacco use     Past Medical History  Diagnosis Date  . CATARACT EXTRACTION, HX OF 09/21/2006  . COLONIC POLYPS, HX OF 09/21/2006  . COPD 09/21/2006  . DIABETES MELLITUS 09/21/2006  . DVT 09/21/2006  . Headache(784.0) 06/13/2007  . HYPERLIPIDEMIA 09/21/2006  . HYPERTENSION 09/21/2006  . HYPOTHYROIDISM 09/21/2006  . HYSTERECTOMY, HX OF 09/21/2006  . NEURALGIA, TRIGEMINAL 10/01/2008  . OSTEOPOROSIS 09/21/2006  . PLANTAR FASCIITIS 09/21/2006  . PRIMARY LOCALIZED OSTEOARTHROSIS HAND 06/13/2007  . THYROIDECTOMY, HX OF 09/21/2006   Past Surgical History   Procedure Laterality Date  . Abdominal hysterectomy    . Cataract extraction    . Thyroidectomy     No family history on file. History  Substance Use Topics  . Smoking status: Former Smoker    Quit date: 01/14/1979  . Smokeless tobacco: Never Used  . Alcohol Use: No   OB History   Grav Para Term Preterm Abortions TAB SAB Ect Mult Living                 Review of Systems  Constitutional: Negative for fever, chills, diaphoresis, activity change, appetite change and fatigue.  HENT: Negative for congestion, facial swelling, rhinorrhea and sore throat.   Eyes: Negative for photophobia and discharge.  Respiratory: Positive for cough, shortness of breath and wheezing. Negative for hemoptysis, sputum production and chest tightness.   Cardiovascular: Negative for chest pain, palpitations, leg swelling and syncope.  Gastrointestinal: Negative for nausea, vomiting, abdominal pain and diarrhea.  Endocrine: Negative for polydipsia and polyuria.  Genitourinary: Negative for dysuria, frequency, difficulty urinating and pelvic pain.  Musculoskeletal: Negative for arthralgias, back pain, neck pain and neck stiffness.  Skin: Negative for color change and wound.  Allergic/Immunologic: Negative for immunocompromised state.  Neurological: Negative for facial asymmetry, weakness, numbness and headaches.  Hematological: Does not bruise/bleed easily.  Psychiatric/Behavioral: Negative for confusion and agitation.    Allergies  Codeine  Home Medications   Current Outpatient Rx  Name  Route  Sig  Dispense  Refill  . albuterol (PROVENTIL HFA;VENTOLIN HFA)  108 (90 BASE) MCG/ACT inhaler   Inhalation   Inhale 2 puffs into the lungs 4 (four) times daily.   1 Inhaler   0   . amitriptyline (ELAVIL) 25 MG tablet   Oral   Take 1 tablet (25 mg total) by mouth at bedtime.   30 tablet   11   . amLODipine (NORVASC) 10 MG tablet   Oral   Take 1 tablet (10 mg total) by mouth daily.   30 tablet    11   . aspirin 81 MG tablet   Oral   Take 81 mg by mouth 3 (three) times daily.          . calcium-vitamin D (OSCAL WITH D) 500-200 MG-UNIT per tablet   Oral   Take 1 tablet by mouth daily.           . diclofenac sodium (VOLTAREN) 1 % GEL   Topical   Apply 2 g topically 4 (four) times daily.   100 g   3   . fish oil-omega-3 fatty acids 1000 MG capsule   Oral   Take 1 g by mouth daily.           . Fluticasone-Salmeterol (ADVAIR DISKUS) 250-50 MCG/DOSE AEPB   Inhalation   Inhale 1 puff into the lungs 2 (two) times daily.   60 each   12   . furosemide (LASIX) 40 MG tablet   Oral   Take 1 tablet (40 mg total) by mouth daily.   30 tablet   11   . HUMULIN 70/30 (70-30) 100 UNIT/ML injection      inject 35 units every morning and 20 units IN THE EVENING   10 mL   11   . levothyroxine (SYNTHROID, LEVOTHROID) 100 MCG tablet   Oral   Take 1 tablet (100 mcg total) by mouth daily.   30 tablet   11   . losartan (COZAAR) 50 MG tablet   Oral   Take 1 tablet (50 mg total) by mouth daily.   90 tablet   3   . moxifloxacin (AVELOX) 400 MG tablet   Oral   Take 1 tablet (400 mg total) by mouth daily.   7 tablet   0   . Multiple Vitamins-Minerals (MULTIVITAMIN WITH MINERALS) tablet   Oral   Take 1 tablet by mouth daily.           . predniSONE (DELTASONE) 10 MG tablet   Oral   Take 1 tablet (10 mg total) by mouth as directed. See AVS   33 tablet   0   . Probiotic Product (ALIGN) 4 MG CAPS   Oral   Take 1 capsule by mouth daily.         Marland Kitchen. Spacer/Aero-Holding Chambers (AEROCHAMBER PLUS FLO-VU MEDIUM) MISC   Other   1 each by Other route once.   1 each   0   . valsartan (DIOVAN) 160 MG tablet   Oral   Take 1 tablet (160 mg total) by mouth daily.   30 tablet   11   . vitamin E 400 UNIT capsule   Oral   Take 400 Units by mouth daily.            BP 143/74  Pulse 102  Temp(Src) 98 F (36.7 C) (Oral)  Resp 24  SpO2 94% Physical Exam   Constitutional: She is oriented to person, place, and time. She appears well-developed and well-nourished. No distress.  HENT:  Head: Normocephalic  and atraumatic.  Mouth/Throat: No oropharyngeal exudate.  Eyes: Pupils are equal, round, and reactive to light.  Neck: Normal range of motion. Neck supple.  Cardiovascular: Normal rate, regular rhythm and normal heart sounds.  Exam reveals no gallop and no friction rub.   No murmur heard. Pulmonary/Chest: Tachypnea noted. No respiratory distress. She has decreased breath sounds in the right middle field, the right lower field, the left upper field, the left middle field and the left lower field. She has wheezes in the right upper field, the right middle field, the right lower field, the left upper field, the left middle field and the left lower field. She has no rales.  Abdominal: Soft. Bowel sounds are normal. She exhibits no distension and no mass. There is no tenderness. There is no rebound and no guarding.  Musculoskeletal: Normal range of motion. She exhibits no edema and no tenderness.  Neurological: She is alert and oriented to person, place, and time.  Skin: Skin is warm and dry.  Psychiatric: She has a normal mood and affect.    ED Course  Procedures (including critical care time) Labs Review Labs Reviewed  BASIC METABOLIC PANEL - Abnormal; Notable for the following:    Sodium 134 (*)    Potassium 3.6 (*)    Chloride 95 (*)    Glucose, Bld 262 (*)    Creatinine, Ser 1.22 (*)    GFR calc non Af Amer 37 (*)    GFR calc Af Amer 43 (*)    All other components within normal limits  CBC  POCT I-STAT TROPONIN I   Imaging Review Dg Chest 2 View (if Patient Has Fever And/or Copd)  01/17/2013   CLINICAL DATA:  Shortness of breath, cough and COPD.  EXAM: CHEST - 2 VIEW  COMPARISON:  12/21/2012  FINDINGS: Stable COPD and bibasilar scarring. No focal consolidation, edema or pneumothorax is identified. The heart size and mediastinal contours  are stable and within normal limits. Stable osteopenia and mild degenerative changes of the spine.  IMPRESSION: Stable COPD.   Electronically Signed   By: Irish Lack M.D.   On: 01/17/2013 14:19    EKG Interpretation    Date/Time:  Tuesday January 17 2013 14:02:10 EST Ventricular Rate:  98 PR Interval:  170 QRS Duration: 84 QT Interval:  316 QTC Calculation: 403 R Axis:   -60 Text Interpretation:  Sinus tachycardia Ventricular premature complex Left anterior fascicular block Borderline repolarization abnormality No significant change since last tracing Confirmed by Anslee Micheletti  MD, Mychal Decarlo ([id number]) on 01/17/2013 3:00:04 PM            MDM   1. COPD exacerbation    Pt is a 80 y.o. female with Pmhx as above who presents with 7-10 days of inc cough, SOB which has actually improved over past several days, but was sent ot ED for eval after PCP exam today. Pt reports no fever, chills, CP, leg pain/swelling. She has albuterol at home which is out.  On PE, Pt is tachypneic, RR in 30's, HR 100, 90-93 % on RA.  She has wheezing, dec air mvmt throughout. Labs from triage show nml trop, CBC, BMP with mild hyponatremia, mild hypokalemia, mild Cr elevation.  CXR w/ stable COPD.  Have ordered duoneb x3, IV solumedrol.   Pt reports feeling improved after duonebs, but clinically is not improved asn now has O2 requirement due to sats in upper 80's.  Will admit to triad for COPD exacerbation.  PO doxy added.  Shanna Cisco, MD 01/18/13 [phone]

## 2013-01-17 NOTE — Progress Notes (Signed)
   CARE MANAGEMENT ED NOTE 01/17/2013  Patient:  [name],[name]   Account Number:  [id number]  Date Initiated:  01/17/2013  Documentation initiated by:  Radford PaxFERRERO,Lewis Grivas  Subjective/Objective Assessment:   Patient presents to Ed with shortness of breath and congestion.     Subjective/Objective Assessment Detail:     Action/Plan:   Patient received IV steroids and nebulizer in the ED. chest xray completed.   Action/Plan Detail:   Anticipated DC Date:       Status Recommendation to Physician:   Result of Recommendation:    Other ED Services  Consult Working Plan    DC Planning Services  Other    Choice offered to / List presented to:            Status of service:  Completed, signed off  ED Comments:   ED Comments Detail:  EDCM spoke to patient and her son Aurther Lofterry at bedside.  As per patient's son, patient lives at home with him.  the only medical equipment the patient has at home is a four prong cane.  Patient does have any home health services at this itme.  Patient has never been in a nursing facility. Patient reports her pcp wrote her a prescripotion for a walker, but she hasn't gotten it filled because, "I don't need it."  Patient does not wear oxygen at home.  EDCM provided patient [place] and gave the list to the patient's son. Patient reports, "I don't think I'm going to need any of those services right now."  Informed patient that if she feels she needs home health services in the future, she can do so by asking her family doctor.  Patient and son thankful for services.  No further CM needs at this time.

## 2013-01-17 NOTE — Progress Notes (Signed)
Subjective:    Patient ID: [name], female    DOB: 09/21/1917, 80 y.o.   MRN: [id number]  HPI [name] presents with a 4 day h/o URI drainage and progressive SOB. She does admit that she is working hard to breath and is getting fatigued. Oxygen saturation is 95% but respiratory rate is 25.  Past Medical History  Diagnosis Date  . CATARACT EXTRACTION, HX OF 09/21/2006  . COLONIC POLYPS, HX OF 09/21/2006  . COPD 09/21/2006  . DIABETES MELLITUS 09/21/2006  . DVT 09/21/2006  . Headache(784.0) 06/13/2007  . HYPERLIPIDEMIA 09/21/2006  . HYPERTENSION 09/21/2006  . HYPOTHYROIDISM 09/21/2006  . HYSTERECTOMY, HX OF 09/21/2006  . NEURALGIA, TRIGEMINAL 10/01/2008  . OSTEOPOROSIS 09/21/2006  . PLANTAR FASCIITIS 09/21/2006  . PRIMARY LOCALIZED OSTEOARTHROSIS HAND 06/13/2007  . THYROIDECTOMY, HX OF 09/21/2006   Past Surgical History  Procedure Laterality Date  . Abdominal hysterectomy    . Cataract extraction    . Thyroidectomy     History reviewed. No pertinent family history. History   Social History  . Marital Status: Widowed    Spouse Name: N/A    Number of Children: 4  . Years of Education: 4th   Occupational History  . retired    Social History Main Topics  . Smoking status: Former Smoker    Quit date: 01/14/1979  . Smokeless tobacco: Never Used  . Alcohol Use: No  . Drug Use: No  . Sexual Activity: No   Other Topics Concern  . Not on file   Social History Narrative   4th grade. Married '44 -'50 yrs/Widowed. 3 sons -'1540, '46, '51, ; 2 dtrs ' 47, one stillborn. Work - Theatre managerfurniture mfg, [employer], Production assistant, radioegg grader. Lives alone, very independent, continues to garden, does all her own housework, avid reader.       End of life care (01/28/09) in a discussion about the recent death sisiter she is very clear that she does not want CPR or intubation or any heroic measures. Will send a packet with out of facility order, "[employer]'s Guide" most form.    Current Outpatient Prescriptions on File Prior to  Visit  Medication Sig Dispense Refill  . albuterol (PROVENTIL HFA;VENTOLIN HFA) 108 (90 BASE) MCG/ACT inhaler Inhale 2 puffs into the lungs 4 (four) times daily.  1 Inhaler  0  . amitriptyline (ELAVIL) 25 MG tablet Take 1 tablet (25 mg total) by mouth at bedtime.  30 tablet  11  . amLODipine (NORVASC) 10 MG tablet Take 1 tablet (10 mg total) by mouth daily.  30 tablet  11  . aspirin 81 MG tablet Take 81 mg by mouth 3 (three) times daily.       . calcium-vitamin D (OSCAL WITH D) 500-200 MG-UNIT per tablet Take 1 tablet by mouth daily.        . diclofenac sodium (VOLTAREN) 1 % GEL Apply 2 g topically 4 (four) times daily.  100 g  3  . fish oil-omega-3 fatty acids 1000 MG capsule Take 1 g by mouth daily.        . Fluticasone-Salmeterol (ADVAIR DISKUS) 250-50 MCG/DOSE AEPB Inhale 1 puff into the lungs 2 (two) times daily.  60 each  12  . furosemide (LASIX) 40 MG tablet Take 1 tablet (40 mg total) by mouth daily.  30 tablet  11  . HUMULIN 70/30 (70-30) 100 UNIT/ML injection inject 35 units every morning and 20 units IN THE EVENING  10 mL  11  . levothyroxine (SYNTHROID, LEVOTHROID)  100 MCG tablet Take 1 tablet (100 mcg total) by mouth daily.  30 tablet  11  . losartan (COZAAR) 50 MG tablet Take 1 tablet (50 mg total) by mouth daily.  90 tablet  3  . moxifloxacin (AVELOX) 400 MG tablet Take 1 tablet (400 mg total) by mouth daily.  7 tablet  0  . Multiple Vitamins-Minerals (MULTIVITAMIN WITH MINERALS) tablet Take 1 tablet by mouth daily.        . predniSONE (DELTASONE) 10 MG tablet Take 1 tablet (10 mg total) by mouth as directed. See AVS  33 tablet  0  . Probiotic Product (ALIGN) 4 MG CAPS Take 1 capsule by mouth daily.      Marland Kitchen Spacer/Aero-Holding Chambers (AEROCHAMBER PLUS FLO-VU MEDIUM) MISC 1 each by Other route once.  1 each  0  . valsartan (DIOVAN) 160 MG tablet Take 1 tablet (160 mg total) by mouth daily.  30 tablet  11  . vitamin E 400 UNIT capsule Take 400 Units by mouth daily.         No  current facility-administered medications on file prior to visit.      Review of Systems System review is negative for any constitutional, cardiac, pulmonary, GI or neuro symptoms or complaints other than as described in the HPI.     Objective:   Physical Exam Filed Vitals:   01/17/13 1306  BP: 144/60  Pulse: 95  Temp: 97.2 F (36.2 C)   O2 sat 95%  Gen'  Elderly woman with marked increased WOB HEENT- C&S clear Cor 2+ radial pulse, regular tachycardia Pulm - increased work of  Breathing with use of accessory chest muscles, neck retraction. She has prolonged expiratory phase and dense wheezing throughout. Neuro - alert and oriented       Assessment & Plan:  Acute Bronchitis - patient with acute respiratory illness, possibly viral, with marked increased WOB and tachycardia.  Plan refer to ED for evaluation, oxygen support and probable admission for IV steroid and O2.   End of life care (01/28/09) in a discussion about the recent death sisiter she is very clear that she does not want CPR or intubation or any heroic measures.

## 2013-01-17 NOTE — ED Notes (Signed)
Pt is SOB and congested since Sunday. Pt has labored breathing and wheezing is noted. Denies pain. Went to PCP and was sent to ED

## 2013-01-18 DIAGNOSIS — E039 Hypothyroidism, unspecified: Secondary | ICD-10-CM

## 2013-01-18 DIAGNOSIS — E785 Hyperlipidemia, unspecified: Secondary | ICD-10-CM

## 2013-01-18 LAB — CBC
HEMATOCRIT: 40.1 % (ref 36.0–46.0)
HEMOGLOBIN: 13.6 g/dL (ref 12.0–15.0)
MCH: 27.8 pg (ref 26.0–34.0)
MCHC: 33.9 g/dL (ref 30.0–36.0)
MCV: 81.8 fL (ref 78.0–100.0)
Platelets: 200 10*3/uL (ref 150–400)
RBC: 4.9 MIL/uL (ref 3.87–5.11)
RDW: 14.4 % (ref 11.5–15.5)
WBC: 9.5 10*3/uL (ref 4.0–10.5)

## 2013-01-18 LAB — BASIC METABOLIC PANEL
BUN: 20 mg/dL (ref 6–23)
CO2: 20 mEq/L (ref 19–32)
Calcium: 9.3 mg/dL (ref 8.4–10.5)
Chloride: 94 mEq/L — ABNORMAL LOW (ref 96–112)
Creatinine, Ser: 1.01 mg/dL (ref 0.50–1.10)
GFR calc Af Amer: 54 mL/min — ABNORMAL LOW (ref >90)
GFR calc non Af Amer: 46 mL/min — ABNORMAL LOW (ref >90)
Glucose, Bld: 446 mg/dL — ABNORMAL HIGH (ref 70–99)
POTASSIUM: 3.6 meq/L — AB (ref 3.7–5.3)
SODIUM: 131 meq/L — AB (ref 137–147)

## 2013-01-18 LAB — GLUCOSE, CAPILLARY
GLUCOSE-CAPILLARY: 309 mg/dL — AB (ref 70–99)
GLUCOSE-CAPILLARY: 432 mg/dL — AB (ref 70–99)
GLUCOSE-CAPILLARY: 360 mg/dL — AB (ref 70–99)
Glucose-Capillary: 389 mg/dL — ABNORMAL HIGH (ref 70–99)
Glucose-Capillary: 305 mg/dL — ABNORMAL HIGH (ref 70–99)

## 2013-01-18 MED ORDER — INSULIN ASPART PROT & ASPART (70-30 MIX) 100 UNIT/ML ~~LOC~~ SUSP
20.0000 [IU] | Freq: Every day | SUBCUTANEOUS | Status: DC
  Administered 2013-01-18 – 2013-01-20 (×3): 20 [IU] via SUBCUTANEOUS
  Filled 2013-01-18: qty 10

## 2013-01-18 MED ORDER — PANTOPRAZOLE SODIUM 40 MG IV SOLR
40.0000 mg | INTRAVENOUS | Status: DC
  Administered 2013-01-18: 21:00:00 40 mg via INTRAVENOUS
  Filled 2013-01-18 (×2): qty 40

## 2013-01-18 MED ORDER — INSULIN ASPART PROT & ASPART (70-30 MIX) 100 UNIT/ML ~~LOC~~ SUSP
35.0000 [IU] | Freq: Every day | SUBCUTANEOUS | Status: DC
Start: 2013-01-19 — End: 2013-01-20
  Administered 2013-01-18 – 2013-01-20 (×3): 35 [IU] via SUBCUTANEOUS

## 2013-01-18 NOTE — Progress Notes (Signed)
CBG 432, MD paged, made aware.

## 2013-01-18 NOTE — Progress Notes (Signed)
Inpatient Diabetes Program Recommendations  AACE/ADA: New Consensus Statement on Inpatient Glycemic Control (2013)  Target Ranges:  Prepandial:   less than 140 mg/dL      Peak postprandial:   less than 180 mg/dL (1-2 hours)      Critically ill patients:  140 - 180 mg/dL   Reason for Visit: Hyperglycemia  Eating well. On 70/30 35 units in am and 20 units in pm.  Sees Dr. Debby BudNorins for diabetes management.  On Solumedrol 80 units Q12.  Results for [name], [name] (MRN [id number]) as of 01/18/2013 12:27  Ref. Range 01/17/2013 16:38 01/17/2013 19:06 01/17/2013 22:40 01/18/2013 07:26  Glucose-Capillary Latest Range: 70-99 mg/dL 147143 (H) 829171 (H) 562219 (H) 309 (H)    Inpatient Diabetes Program Recommendations Insulin - Basal: Start 70/30 35 units in am and 20 units in pm  Note: Will follow while inpatient. Thank you. Ailene Ardshonda Ellanora Rayborn, RD, LDN, CDE Inpatient Diabetes Coordinator [phone]

## 2013-01-18 NOTE — Progress Notes (Signed)
[facility] PROGRESS NOTE  [name] ZOX:[id number] DOB: 02/17/1917 DOA: 01/17/2013 PCP: Illene RegulusMichael Norins, MD  Assessment/Plan: Principal Problem:   Acute respiratory failure with hypoxia - Continue Solu-Medrol and doxycycline as well as nebulizers and oxygen supplementation  Active Problems:   DIABETES MELLITUS - Continue diabetic diet and SSI - Continue routine CBG monitoring    HYPERTENSION - Patient on amlodipine and losartan with blood pressures relatively well controlled on this regimen. We'll continue to monitor    COPD exacerbation - Cause of hypoxia please refer to above recommendations under principal problem    Acute viral syndrome - Most likely triggering COPD exacerbation and subsequently hypoxia  Code Status: DNR Family Communication: Discussed directly with patient Disposition Plan: Pending improvement in respiratory condition   Consultants: None Procedures:  None  Antibiotics:  Doxycycline  HPI/Subjective: No new complaints overnight. No acute issues reported to me by patient  Objective: Filed Vitals:   01/18/13 1358  BP: 128/45  Pulse: 91  Temp: 98 F (36.7 C)  Resp: 20    Intake/Output Summary (Last 24 hours) at 01/18/13 1733 Last data filed at 01/18/13 1300  Gross per 24 hour  Intake    480 ml  Output   1650 ml  Net  -1170 ml   Filed Weights   01/17/13 2249  Weight: 60.601 kg (133 lb 9.6 oz)    Exam:   General:  Patient in no acute distress, alert  Cardiovascular: Normal S1 and S2, no murmurs  Respiratory: Breath sounds bilaterally, mild expiratory wheeze, prolonged expiratory phase  Abdomen: Soft, nontender nondistended  Musculoskeletal: No cyanosis or clubbing  Data Reviewed: Basic Metabolic Panel:  Recent Labs Lab 01/17/13 1420 01/18/13 0830  NA 134* 131*  K 3.6* 3.6*  CL 95* 94*  CO2 22 20  GLUCOSE 262* 446*  BUN 21 20  CREATININE 1.22* 1.01  CALCIUM 9.1 9.3   Liver Function Tests: No results  found for this basename: AST, ALT, ALKPHOS, BILITOT, PROT, ALBUMIN,  in the last 168 hours No results found for this basename: LIPASE, AMYLASE,  in the last 168 hours No results found for this basename: AMMONIA,  in the last 168 hours CBC:  Recent Labs Lab 01/17/13 1420 01/18/13 0830  WBC 9.4 9.5  HGB 14.4 13.6  HCT 42.4 40.1  MCV 83.0 81.8  PLT 176 200   Cardiac Enzymes: No results found for this basename: CKTOTAL, CKMB, CKMBINDEX, TROPONINI,  in the last 168 hours BNP (last 3 results) No results found for this basename: PROBNP,  in the last 8760 hours CBG:  Recent Labs Lab 01/17/13 1906 01/17/13 2240 01/18/13 0726 01/18/13 1202 01/18/13 1451  GLUCAP 171* 219* 309* 432* 389*    No results found for this or any previous visit (from the past 240 hour(s)).   Studies: Dg Chest 2 View (if Patient Has Fever And/or Copd)  01/17/2013   CLINICAL DATA:  Shortness of breath, cough and COPD.  EXAM: CHEST - 2 VIEW  COMPARISON:  12/21/2012  FINDINGS: Stable COPD and bibasilar scarring. No focal consolidation, edema or pneumothorax is identified. The heart size and mediastinal contours are stable and within normal limits. Stable osteopenia and mild degenerative changes of the spine.  IMPRESSION: Stable COPD.   Electronically Signed   By: Irish LackGlenn  Yamagata M.D.   On: 01/17/2013 14:19    Scheduled Meds: . amitriptyline  25 mg Oral QHS  . amLODipine  10 mg Oral Daily  . calcium-vitamin D  1 tablet Oral Daily  .  doxycycline  100 mg Oral Q12H  . enoxaparin (LOVENOX) injection  30 mg Subcutaneous QHS  . furosemide  40 mg Oral Daily  . insulin aspart  0-9 Units Subcutaneous TID WC  . insulin aspart protamine- aspart  20 Units Subcutaneous Q supper  . [START ON 01/19/2013] insulin aspart protamine- aspart  35 Units Subcutaneous Q breakfast  . ipratropium-albuterol  3 mL Nebulization Q6H  . levothyroxine  100 mcg Oral Daily  . losartan  50 mg Oral Daily  . methylPREDNISolone (SOLU-MEDROL)  injection  80 mg Intravenous Q12H  . OPTICHAMBER ADVANTAGE  1 each Other Once  . pantoprazole (PROTONIX) IV  40 mg Intravenous Q24H  . sodium chloride  3 mL Intravenous Q12H  . sodium chloride  3 mL Intravenous Q12H  . vitamin E  400 Units Oral Daily   Continuous Infusions:   Time spent: > 35 min    [name]  [facility] Pager [phone] If 7PM-7AM, please contact night-coverage at [email or web address], password [facility] 01/18/2013, 5:33 PM  LOS: 1 day

## 2013-01-19 LAB — INFLUENZA PANEL BY PCR (TYPE A & B)
H1N1FLUPCR: NOT DETECTED
INFLBPCR: NEGATIVE
Influenza A By PCR: NEGATIVE

## 2013-01-19 LAB — GLUCOSE, CAPILLARY
GLUCOSE-CAPILLARY: 338 mg/dL — AB (ref 70–99)
Glucose-Capillary: 367 mg/dL — ABNORMAL HIGH (ref 70–99)
Glucose-Capillary: 372 mg/dL — ABNORMAL HIGH (ref 70–99)
Glucose-Capillary: 305 mg/dL — ABNORMAL HIGH (ref 70–99)

## 2013-01-19 MED ORDER — INSULIN ASPART 100 UNIT/ML ~~LOC~~ SOLN
0.0000 [IU] | Freq: Three times a day (TID) | SUBCUTANEOUS | Status: DC
  Administered 2013-01-19: 20 [IU] via SUBCUTANEOUS
  Administered 2013-01-20: 7 [IU] via SUBCUTANEOUS
  Administered 2013-01-20: 13:00:00 15 [IU] via SUBCUTANEOUS
  Administered 2013-01-20: 11 [IU] via SUBCUTANEOUS
  Administered 2013-01-21: 09:00:00 15 [IU] via SUBCUTANEOUS
  Administered 2013-01-21: 7 [IU] via SUBCUTANEOUS

## 2013-01-19 MED ORDER — INSULIN ASPART 100 UNIT/ML ~~LOC~~ SOLN
0.0000 [IU] | Freq: Every day | SUBCUTANEOUS | Status: DC
  Administered 2013-01-19: 4 [IU] via SUBCUTANEOUS
  Administered 2013-01-20: 23:00:00 3 [IU] via SUBCUTANEOUS

## 2013-01-19 MED ORDER — PANTOPRAZOLE SODIUM 40 MG PO TBEC
40.0000 mg | DELAYED_RELEASE_TABLET | Freq: Every day | ORAL | Status: DC
  Administered 2013-01-19 – 2013-01-21 (×3): 40 mg via ORAL
  Filled 2013-01-19 (×3): qty 1

## 2013-01-19 NOTE — Progress Notes (Signed)
PHARMACIST - PHYSICIAN COMMUNICATION DR:   Cena BentonVega CONCERNING: Proton Pump Inhibitor IV to Oral Route Change Policy  The patient is receiving Protonix by the intravenous route. Based on criteria approved by the Pharmacy and Therapeutics Committee and the Medical Executive Committee, the medication is being converted to the equivalent oral dose form.   These criteria include:  -No Active GI bleeding  -Able to tolerate diet of full liquids (or better) or tube feeding  -Able to tolerate other medications by the oral or enteral route   If you have any questions about this conversion, please contact the Pharmacy Department (ext [phone]). Thank you.  Loralee PacasErin Jamil Armwood, PharmD, BCPS 01/19/2013 1:01 PM

## 2013-01-19 NOTE — Progress Notes (Signed)
[facility] PROGRESS NOTE  [name] [id number]:[id number] DOB: 01/15/1917 DOA: 01/17/2013 PCP: Illene RegulusMichael Norins, MD  Assessment/Plan: Principal Problem:   Acute respiratory failure with hypoxia - Continue Solu-Medrol and doxycycline as well as nebulizers and oxygen supplementation  Active Problems:   DIABETES MELLITUS - Continue diabetic diet and SSI - Continue routine CBG monitoring    HYPERTENSION - Patient on amlodipine and losartan with blood pressures relatively well controlled on this regimen. We'll continue to monitor    COPD exacerbation - Cause of hypoxia please refer to above recommendations under principal problem    Acute viral syndrome - Most likely triggering COPD exacerbation and subsequently hypoxia - Influenza PCR negative  Code Status: DNR Family Communication: Discussed directly with patient Disposition Plan: Pending improvement in respiratory condition   Consultants: None Procedures:  None  Antibiotics:  Doxycycline  HPI/Subjective: No new complaints overnight. No acute issues reported to me by patient  Objective: Filed Vitals:   01/19/13 1345  BP: 132/38  Pulse: 87  Temp: 97.4 F (36.3 C)  Resp: 22    Intake/Output Summary (Last 24 hours) at 01/19/13 1927 Last data filed at 01/19/13 1300  Gross per 24 hour  Intake    240 ml  Output    450 ml  Net   -210 ml   Filed Weights   01/17/13 2249  Weight: 60.601 kg (133 lb 9.6 oz)    Exam:   General:  Patient in no acute distress, alert  Cardiovascular: Normal S1 and S2, no murmurs  Respiratory: Breath sounds bilaterally, mild expiratory wheeze, prolonged expiratory phase  Abdomen: Soft, nontender nondistended  Musculoskeletal: No cyanosis or clubbing  Data Reviewed: Basic Metabolic Panel:  Recent Labs Lab 01/17/13 1420 01/18/13 0830  NA 134* 131*  K 3.6* 3.6*  CL 95* 94*  CO2 22 20  GLUCOSE 262* 446*  BUN 21 20  CREATININE 1.22* 1.01  CALCIUM 9.1 9.3   Liver  Function Tests: No results found for this basename: AST, ALT, ALKPHOS, BILITOT, PROT, ALBUMIN,  in the last 168 hours No results found for this basename: LIPASE, AMYLASE,  in the last 168 hours No results found for this basename: AMMONIA,  in the last 168 hours CBC:  Recent Labs Lab 01/17/13 1420 01/18/13 0830  WBC 9.4 9.5  HGB 14.4 13.6  HCT 42.4 40.1  MCV 83.0 81.8  PLT 176 200   Cardiac Enzymes: No results found for this basename: CKTOTAL, CKMB, CKMBINDEX, TROPONINI,  in the last 168 hours BNP (last 3 results) No results found for this basename: PROBNP,  in the last 8760 hours CBG:  Recent Labs Lab 01/18/13 1726 01/18/13 2227 01/19/13 0748 01/19/13 1141 01/19/13 1655  GLUCAP 360* 305* 338* 367* 372*    No results found for this or any previous visit (from the past 240 hour(s)).   Studies: No results found.  Scheduled Meds: . amitriptyline  25 mg Oral QHS  . amLODipine  10 mg Oral Daily  . calcium-vitamin D  1 tablet Oral Daily  . doxycycline  100 mg Oral Q12H  . enoxaparin (LOVENOX) injection  30 mg Subcutaneous QHS  . furosemide  40 mg Oral Daily  . insulin aspart  0-20 Units Subcutaneous TID WC  . insulin aspart  0-5 Units Subcutaneous QHS  . insulin aspart protamine- aspart  20 Units Subcutaneous Q supper  . insulin aspart protamine- aspart  35 Units Subcutaneous Q breakfast  . ipratropium-albuterol  3 mL Nebulization Q6H  . levothyroxine  100  mcg Oral Daily  . losartan  50 mg Oral Daily  . methylPREDNISolone (SOLU-MEDROL) injection  80 mg Intravenous Q12H  . OPTICHAMBER ADVANTAGE  1 each Other Once  . pantoprazole  40 mg Oral Daily  . sodium chloride  3 mL Intravenous Q12H  . vitamin E  400 Units Oral Daily   Continuous Infusions:   Time spent: > 35 min    [name]  [facility] Pager [phone] If 7PM-7AM, please contact night-coverage at [email or web address], password  [facility] 01/19/2013, 7:27 PM  LOS: 2 days

## 2013-01-20 DIAGNOSIS — J449 Chronic obstructive pulmonary disease, unspecified: Secondary | ICD-10-CM

## 2013-01-20 LAB — GLUCOSE, CAPILLARY
GLUCOSE-CAPILLARY: 276 mg/dL — AB (ref 70–99)
Glucose-Capillary: 303 mg/dL — ABNORMAL HIGH (ref 70–99)
Glucose-Capillary: 226 mg/dL — ABNORMAL HIGH (ref 70–99)
Glucose-Capillary: 256 mg/dL — ABNORMAL HIGH (ref 70–99)

## 2013-01-20 MED ORDER — INSULIN ASPART PROT & ASPART (70-30 MIX) 100 UNIT/ML ~~LOC~~ SUSP: 25.0000 [IU] | Freq: Every day | SUBCUTANEOUS | Status: DC

## 2013-01-20 MED ORDER — AMLODIPINE BESYLATE 5 MG PO TABS
5.0000 mg | ORAL_TABLET | Freq: Every day | ORAL | Status: DC
  Administered 2013-01-21: 10:00:00 5 mg via ORAL
  Filled 2013-01-20: qty 1

## 2013-01-20 MED ORDER — INSULIN ASPART PROT & ASPART (70-30 MIX) 100 UNIT/ML ~~LOC~~ SUSP
40.0000 [IU] | Freq: Every day | SUBCUTANEOUS | Status: DC
  Administered 2013-01-21: 09:00:00 40 [IU] via SUBCUTANEOUS

## 2013-01-20 NOTE — Progress Notes (Signed)
[facility] PROGRESS NOTE  [name] [id number]:[id number] DOB: Oct 01, 1917 DOA: 01/17/2013 PCP: Illene Regulus, MD  Assessment/Plan: Principal Problem:   Acute respiratory failure with hypoxia - Continue Solu-Medrol and doxycycline as well as nebulizers and oxygen supplementation  Active Problems:   DIABETES MELLITUS - Continue diabetic diet and SSI - Continue routine CBG monitoring - Blood sugars still elevated will increase NovoLog 70/30 dose as recommended by diabetic coordinator with 40 units in the a.m. and 25 in the evening    HYPERTENSION - Patient currently on amlodipine valsartan. Given low diastolic values will decrease amlodipine dosage    COPD exacerbation - Cause of hypoxia please refer to above recommendations under principal problem    Acute viral syndrome - Most likely triggering COPD exacerbation and subsequently hypoxia - Influenza PCR negative  Code Status: DNR Family Communication: Discussed directly with patient Disposition Plan: Pending improvement in respiratory condition   Consultants: None Procedures:  None  Antibiotics:  Doxycycline  HPI/Subjective: No new complaints overnight. Patient is feeling better today. Was able to ambulate  Objective: Filed Vitals:   01/20/13 1402  BP: 129/40  Pulse: 72  Temp: 98 F (36.7 C)  Resp: 18    Intake/Output Summary (Last 24 hours) at 01/20/13 1703 Last data filed at 01/20/13 1300  Gross per 24 hour  Intake    480 ml  Output   1850 ml  Net  -1370 ml   Filed Weights   01/17/13 2249  Weight: 60.601 kg (133 lb 9.6 oz)    Exam:   General:  Patient in no acute distress, alert  Cardiovascular: Normal S1 and S2, no murmurs  Respiratory: Breath sounds bilaterally, mild expiratory wheeze, prolonged expiratory phase  Abdomen: Soft, nontender nondistended  Musculoskeletal: No cyanosis or clubbing  Data Reviewed: Basic Metabolic Panel:  Recent Labs Lab 01/17/13 1420 01/18/13 0830  NA  134* 131*  K 3.6* 3.6*  CL 95* 94*  CO2 22 20  GLUCOSE 262* 446*  BUN 21 20  CREATININE 1.22* 1.01  CALCIUM 9.1 9.3   Liver Function Tests: No results found for this basename: AST, ALT, ALKPHOS, BILITOT, PROT, ALBUMIN,  in the last 168 hours No results found for this basename: LIPASE, AMYLASE,  in the last 168 hours No results found for this basename: AMMONIA,  in the last 168 hours CBC:  Recent Labs Lab 01/17/13 1420 01/18/13 0830  WBC 9.4 9.5  HGB 14.4 13.6  HCT 42.4 40.1  MCV 83.0 81.8  PLT 176 200   Cardiac Enzymes: No results found for this basename: CKTOTAL, CKMB, CKMBINDEX, TROPONINI,  in the last 168 hours BNP (last 3 results) No results found for this basename: PROBNP,  in the last 8760 hours CBG:  Recent Labs Lab 01/19/13 1141 01/19/13 1655 01/19/13 2237 01/20/13 0743 01/20/13 1137  GLUCAP 367* 372* 305* 276* 303*    No results found for this or any previous visit (from the past 240 hour(s)).   Studies: No results found.  Scheduled Meds: . amitriptyline  25 mg Oral QHS  . amLODipine  10 mg Oral Daily  . calcium-vitamin D  1 tablet Oral Daily  . doxycycline  100 mg Oral Q12H  . enoxaparin (LOVENOX) injection  30 mg Subcutaneous QHS  . furosemide  40 mg Oral Daily  . insulin aspart  0-20 Units Subcutaneous TID WC  . insulin aspart  0-5 Units Subcutaneous QHS  . insulin aspart protamine- aspart  20 Units Subcutaneous Q supper  . insulin aspart protamine-  aspart  35 Units Subcutaneous Q breakfast  . ipratropium-albuterol  3 mL Nebulization Q6H  . levothyroxine  100 mcg Oral Daily  . losartan  50 mg Oral Daily  . methylPREDNISolone (SOLU-MEDROL) injection  80 mg Intravenous Q12H  . OPTICHAMBER ADVANTAGE  1 each Other Once  . pantoprazole  40 mg Oral Daily  . sodium chloride  3 mL Intravenous Q12H  . vitamin E  400 Units Oral Daily   Continuous Infusions:   Time spent: > 35 min    Penny PiaVEGA, Joellen Tullos  [facility] Pager [phone] If  7PM-7AM, please contact night-coverage at [email or web address], password [email or web address] 01/20/2013, 5:03 PM  LOS: 3 days

## 2013-01-20 NOTE — Progress Notes (Signed)
Inpatient Diabetes Program Recommendations  AACE/ADA: New Consensus Statement on Inpatient Glycemic Control (2013)  Target Ranges:  Prepandial:   less than 140 mg/dL      Peak postprandial:   less than 180 mg/dL (1-2 hours)      Critically ill patients:  140 - 180 mg/dL   Reason for Visit: Hyperglycemia  Pt states blood sugars are usually well-controlled at home, mainly <200 mg/dL. On IV steroids at present. Pt eating well.  Results for Thermon LeylandRENSHAW, Darlena (MRN [id number]) as of 01/20/2013 16:14  Ref. Range 01/19/2013 16:55 01/19/2013 22:37 01/20/2013 07:43 01/20/2013 11:37  Glucose-Capillary Latest Range: 70-99 mg/dL 409372 (H) 811305 (H) 914276 (H) 303 (H)  Hyperglycemia with IV steroids. Recommendations: Check HgbA1C to assess glycemic control prior to hospitalization. Increase 70/30 insulin to 40 units in am and 25 units in pm. Titrate when steroids are decreased.  Thank you. Ailene Ardshonda Ercell Perlman, RD, LDN, CDE Inpatient Diabetes Coordinator [phone]

## 2013-01-21 DIAGNOSIS — J209 Acute bronchitis, unspecified: Secondary | ICD-10-CM

## 2013-01-21 LAB — GLUCOSE, CAPILLARY
Glucose-Capillary: 314 mg/dL — ABNORMAL HIGH (ref 70–99)
Glucose-Capillary: 233 mg/dL — ABNORMAL HIGH (ref 70–99)

## 2013-01-21 MED ORDER — INSULIN NPH ISOPHANE & REGULAR (70-30) 100 UNIT/ML ~~LOC~~ SUSP: SUBCUTANEOUS | Status: DC

## 2013-01-21 MED ORDER — DOXYCYCLINE HYCLATE 100 MG PO TABS: 100.0000 mg | ORAL_TABLET | Freq: Two times a day (BID) | ORAL | Status: DC

## 2013-01-21 MED ORDER — PREDNISONE 50 MG PO TABS: 50.0000 mg | ORAL_TABLET | Freq: Every day | ORAL | Status: DC

## 2013-01-21 NOTE — Progress Notes (Signed)
Discharge patient to home, son at bedside. D/c instructions done and was given to the patient. PIV removed no s/s of infiltration or swelling noted. Discharged to home no complaints of pain or discomfort noted.

## 2013-01-21 NOTE — Progress Notes (Signed)
O2Sats at rest on 2L/min - 97% At rest without O2 - 94% Post ambulation without O2 - 97%    , pt tachypneic RR-25

## 2013-01-21 NOTE — Discharge Summary (Addendum)
Physician Discharge Summary  Collene Massimino ZOX:[id number] DOB: 09-22-1917 DOA: 01/17/2013  PCP: Illene Regulus, MD  Admit date: 01/17/2013 Discharge date: 01/21/2013  Time spent: > 35 minutes  Recommendations for Outpatient Follow-up:  1. Please be sure to followup with your primary care physician also keep a log of your blood sugars and present this to your memory care physician for further adjustment of your hypoglycemic agents 2. Continue to monitor blood pressures  Addendum: Of note on review of prior CT scan of chest did make mention of the following findings. The study is from October 2014 IMPRESSION:  1. Multiple bilateral pulmonary nodules. Granulomatous disease  including active granulomatous disease and or metastatic disease  could present in this fashion. PET-CT may prove useful for further  evaluation.  2. Small bilateral breast nodular densities. Mammography may prove  useful for further evaluation. There are associated small bilateral  axillary lymph nodes. These are nonspecific.   This is a note to patient's primary care physician to continue following up on the above findings.  Discharge Diagnoses:  Principal Problem:   Acute respiratory failure with hypoxia Active Problems:   DIABETES MELLITUS   HYPERTENSION   COPD   COPD exacerbation   Acute viral syndrome   Discharge Condition: Stable Diet recommendation: Carb modified diet  Filed Weights   01/17/13 2249  Weight: 60.601 kg (133 lb 9.6 oz)    History of present illness:  80 year old with history of asthma who presented to the ED complaining of 7 days of upper respiratory symptoms and wheeze. Was reportedly recently exposed to sick contacts during the holidays.  Hospital Course:  Principal Problem:  Acute respiratory failure with hypoxia  - Patient was able to be weaned off of oxygen. And will be discharged home on prednisone for 2 more days to complete a five-day total course. As well as antibiotics to  complete a 7 day regimen  Active Problems:   DIABETES MELLITUS  - Continue diabetic diet - Continue routine CBG monitoring  - Will increase Humulin 70/30 dose as recommended by diabetic coordinator with 40 units in the a.m. and 25 in the evening   HYPERTENSION  - We'll plan on discharging him home regimen  COPD exacerbation  - Cause of hypoxia please refer to above recommendations under principal problem   Acute viral syndrome  - Most likely triggering COPD exacerbation and subsequently hypoxia  - Influenza PCR negative  Pulmonary nodules: - Patient to continue following up with primary care for further evaluation and recommendations.  Procedures:  None  Consultations:  None  Discharge Exam: Filed Vitals:   01/21/13 1416  BP: 129/57  Pulse: 72  Temp: 97.8 F (36.6 C)  Resp: 18    General: Pt in NAD, Alert and Awake Cardiovascular: RRR, no MRG Respiratory: CTA BL, no wheezes  Discharge Instructions  Discharge Orders   Future Orders Complete By Expires   Call MD for:  difficulty breathing, headache or visual disturbances  As directed    Call MD for:  redness, tenderness, or signs of infection (pain, swelling, redness, odor or green/yellow discharge around incision site)  As directed    Call MD for:  temperature >100.4  As directed    Diet - low sodium heart healthy  As directed    Discharge instructions  As directed    Comments:     Your primary care physician within the next one to 2 weeks or sooner should any new concerns arise.   Increase activity slowly  As directed        Medication List    STOP taking these medications       moxifloxacin 400 MG tablet  Commonly known as:  AVELOX     valsartan 160 MG tablet  Commonly known as:  DIOVAN      TAKE these medications       AEROCHAMBER PLUS FLO-VU MEDIUM Misc  1 each by Other route once.     albuterol 108 (90 BASE) MCG/ACT inhaler  Commonly known as:  PROVENTIL HFA;VENTOLIN HFA  Inhale 2 puffs  into the lungs 4 (four) times daily.     ALIGN 4 MG Caps  Take 1 capsule by mouth daily.     amitriptyline 25 MG tablet  Commonly known as:  ELAVIL  Take 1 tablet (25 mg total) by mouth at bedtime.     amLODipine 10 MG tablet  Commonly known as:  NORVASC  Take 1 tablet (10 mg total) by mouth daily.     aspirin 81 MG tablet  Take 81 mg by mouth 3 (three) times daily.     calcium-vitamin D 500-200 MG-UNIT per tablet  Commonly known as:  OSCAL WITH D  Take 1 tablet by mouth daily.     diclofenac sodium 1 % Gel  Commonly known as:  VOLTAREN  Apply 2 g topically 4 (four) times daily.     doxycycline 100 MG tablet  Commonly known as:  VIBRA-TABS  Take 1 tablet (100 mg total) by mouth every 12 (twelve) hours.     fish oil-omega-3 fatty acids 1000 MG capsule  Take 1 g by mouth daily.     Fluticasone-Salmeterol 250-50 MCG/DOSE Aepb  Commonly known as:  ADVAIR DISKUS  Inhale 1 puff into the lungs 2 (two) times daily.     furosemide 40 MG tablet  Commonly known as:  LASIX  Take 1 tablet (40 mg total) by mouth daily.     insulin NPH-regular Human (70-30) 100 UNIT/ML injection  Commonly known as:  HUMULIN 70/30  inject 40 units every morning and 25 units IN THE EVENING     levothyroxine 100 MCG tablet  Commonly known as:  SYNTHROID, LEVOTHROID  Take 1 tablet (100 mcg total) by mouth daily.     losartan 50 MG tablet  Commonly known as:  COZAAR  Take 1 tablet (50 mg total) by mouth daily.     multivitamin with minerals tablet  Take 1 tablet by mouth daily.     predniSONE 50 MG tablet  Commonly known as:  DELTASONE  Take 1 tablet (50 mg total) by mouth daily with breakfast. See AVS     vitamin E 400 UNIT capsule  Take 400 Units by mouth daily.       Allergies  Allergen Reactions  . Codeine Nausea And Vomiting      The results of significant diagnostics from this hospitalization (including imaging, microbiology, ancillary and laboratory) are listed below for  reference.    Significant Diagnostic Studies: Dg Chest 2 View (if Patient Has Fever And/or Copd)  01/17/2013   CLINICAL DATA:  Shortness of breath, cough and COPD.  EXAM: CHEST - 2 VIEW  COMPARISON:  12/21/2012  FINDINGS: Stable COPD and bibasilar scarring. No focal consolidation, edema or pneumothorax is identified. The heart size and mediastinal contours are stable and within normal limits. Stable osteopenia and mild degenerative changes of the spine.  IMPRESSION: Stable COPD.   Electronically Signed   By: Rudene AndaGlenn  Yamagata M.D.  On: 01/17/2013 14:19    Microbiology: No results found for this or any previous visit (from the past 240 hour(s)).   Labs: Basic Metabolic Panel:  Recent Labs Lab 01/17/13 1420 01/18/13 0830  NA 134* 131*  K 3.6* 3.6*  CL 95* 94*  CO2 22 20  GLUCOSE 262* 446*  BUN 21 20  CREATININE 1.22* 1.01  CALCIUM 9.1 9.3   Liver Function Tests: No results found for this basename: AST, ALT, ALKPHOS, BILITOT, PROT, ALBUMIN,  in the last 168 hours No results found for this basename: LIPASE, AMYLASE,  in the last 168 hours No results found for this basename: AMMONIA,  in the last 168 hours CBC:  Recent Labs Lab 01/17/13 1420 01/18/13 0830  WBC 9.4 9.5  HGB 14.4 13.6  HCT 42.4 40.1  MCV 83.0 81.8  PLT 176 200   Cardiac Enzymes: No results found for this basename: CKTOTAL, CKMB, CKMBINDEX, TROPONINI,  in the last 168 hours BNP: BNP (last 3 results) No results found for this basename: PROBNP,  in the last 8760 hours CBG:  Recent Labs Lab 01/20/13 1137 01/20/13 1704 01/20/13 2255 01/21/13 0734 01/21/13 1210  GLUCAP 303* 226* 256* 314* 233*       Signed:  [name]  [facility] 01/21/2013, 3:56 PM

## 2013-01-23 ENCOUNTER — Other Ambulatory Visit: Payer: Self-pay | Admitting: Internal Medicine

## 2013-02-06 ENCOUNTER — Ambulatory Visit (INDEPENDENT_AMBULATORY_CARE_PROVIDER_SITE_OTHER): Payer: Medicare Other | Admitting: Internal Medicine

## 2013-02-06 ENCOUNTER — Encounter: Payer: Self-pay | Admitting: Internal Medicine

## 2013-02-06 VITALS — BP 120/50 | HR 97 | Temp 97.2°F | Wt 134.8 lb

## 2013-02-06 DIAGNOSIS — E119 Type 2 diabetes mellitus without complications: Secondary | ICD-10-CM

## 2013-02-06 DIAGNOSIS — J449 Chronic obstructive pulmonary disease, unspecified: Secondary | ICD-10-CM

## 2013-02-06 MED ORDER — INSULIN NPH ISOPHANE & REGULAR (70-30) 100 UNIT/ML ~~LOC~~ SUSP: SUBCUTANEOUS | Status: DC

## 2013-02-06 NOTE — Progress Notes (Signed)
   Subjective:    Patient ID: [name], female    DOB: 08/01/1917, 80 y.o.   MRN: [id number]  HPI [name] was last seen Jan 6th at which time she was referred to ED and subsequently admitted for viral AECOPD requiring IV antibiotics and antibiotics. During her hospital stay she did well and was discharged home to finish antibiotics and steroids.  During her hospital stay she was seen by diabetic team and had her 70/30 insulin increased to 40 u AM and 25 u PM.  The hospital attending did note her CT from Nov 14 with pulmonary nodules. This issue has been addressed on previous visits: given her advanced age and lack of symptoms she has made a decision to not pursue further evaluation.   Since being home she has been doing well. She did have several hypoglycemic events and has reduced her insulin to her home regimen: 35 u AM  20 u PM.   Review of Systems System review is negative for any constitutional, cardiac, pulmonary, GI or neuro symptoms or complaints other than as described in the HPI.     Objective:   Physical Exam Filed Vitals:   02/06/13 0846  BP: 120/50  Pulse: 97  Temp: 97.2 F (36.2 C)   gen'l- WNWD woman looking younger than her stated age in no distress HEENT- C&S clear Cor 2+ radial pulse, RRR, II/VII systolic mm Pulm - good breath sounds, no wheezing, no increased WOB Neuro - A&O x 3, mentation very clear       Assessment & Plan:  1. AECOPD - resolved

## 2013-02-06 NOTE — Progress Notes (Signed)
Pre visit review using our clinic review tool, if applicable. No additional management support is needed unless otherwise documented below in the visit note. 

## 2013-02-06 NOTE — Assessment & Plan Note (Signed)
Had transient rise in CBGs due to IV steroids and illness. Sent home on 40 u and 25 u 70/30 but at home had hypoglycemia  Plan Resume previous regimen of 35 u AM  20 u PM

## 2013-02-06 NOTE — Assessment & Plan Note (Signed)
AECOPD - Jan 6th-10th, 2015 - good recovery with steroids and antibiotics. Seen today with normal respirations and exam

## 2013-02-06 NOTE — Patient Instructions (Signed)
1. Acute exacerbation of COPD - you are making a good recovery. No additional treatment is needed.  2. Diabetes - you are right: the increased dose of insulin was based ont he higher blood sugars due to the steroids and infection. Plan Continue your present dosing.

## 2013-02-07 ENCOUNTER — Telehealth: Payer: Self-pay | Admitting: Internal Medicine

## 2013-02-07 NOTE — Telephone Encounter (Signed)
Relevant patient education assigned to patient using Emmi. ° °

## 2013-02-09 ENCOUNTER — Telehealth: Payer: Self-pay

## 2013-02-09 NOTE — Telephone Encounter (Signed)
Relevant patient education assigned to patient using Emmi. ° °

## 2013-02-12 ENCOUNTER — Other Ambulatory Visit: Payer: Self-pay | Admitting: Internal Medicine

## 2013-02-24 ENCOUNTER — Other Ambulatory Visit: Payer: Self-pay | Admitting: Internal Medicine

## 2013-03-05 ENCOUNTER — Other Ambulatory Visit: Payer: Self-pay | Admitting: Internal Medicine

## 2013-05-02 ENCOUNTER — Other Ambulatory Visit: Payer: Self-pay | Admitting: *Deleted

## 2013-05-02 MED ORDER — "INSULIN SYRINGE-NEEDLE U-100 31G X 5/16"" 0.5 ML MISC": Status: DC

## 2013-05-30 ENCOUNTER — Other Ambulatory Visit (INDEPENDENT_AMBULATORY_CARE_PROVIDER_SITE_OTHER): Payer: Medicare Other

## 2013-05-30 ENCOUNTER — Ambulatory Visit (INDEPENDENT_AMBULATORY_CARE_PROVIDER_SITE_OTHER)
Admission: RE | Admit: 2013-05-30 | Discharge: 2013-05-30 | Disposition: A | Discharge status: Home / Self Care | Payer: Medicare Other | Source: Ambulatory Visit | Attending: Internal Medicine | Admitting: Internal Medicine

## 2013-05-30 ENCOUNTER — Encounter: Payer: Self-pay | Admitting: Internal Medicine

## 2013-05-30 ENCOUNTER — Ambulatory Visit (INDEPENDENT_AMBULATORY_CARE_PROVIDER_SITE_OTHER): Payer: Medicare Other | Admitting: Internal Medicine

## 2013-05-30 VITALS — BP 146/70 | HR 88 | Temp 98.2°F | Resp 15 | Wt 138.8 lb

## 2013-05-30 DIAGNOSIS — R0600 Dyspnea, unspecified: Secondary | ICD-10-CM

## 2013-05-30 DIAGNOSIS — R609 Edema, unspecified: Secondary | ICD-10-CM

## 2013-05-30 DIAGNOSIS — M79609 Pain in unspecified limb: Secondary | ICD-10-CM

## 2013-05-30 DIAGNOSIS — M79604 Pain in right leg: Secondary | ICD-10-CM

## 2013-05-30 DIAGNOSIS — R0609 Other forms of dyspnea: Secondary | ICD-10-CM

## 2013-05-30 DIAGNOSIS — J441 Chronic obstructive pulmonary disease with (acute) exacerbation: Secondary | ICD-10-CM

## 2013-05-30 DIAGNOSIS — IMO0001 Reserved for inherently not codable concepts without codable children: Secondary | ICD-10-CM

## 2013-05-30 DIAGNOSIS — R0989 Other specified symptoms and signs involving the circulatory and respiratory systems: Secondary | ICD-10-CM

## 2013-05-30 LAB — CBC WITH DIFFERENTIAL/PLATELET
Basophils Absolute: 0 10*3/uL (ref 0.0–0.1)
Basophils Relative: 0.5 % (ref 0.0–3.0)
Eosinophils Absolute: 0.2 10*3/uL (ref 0.0–0.7)
Eosinophils Relative: 2.8 % (ref 0.0–5.0)
HCT: 46.7 % — ABNORMAL HIGH (ref 36.0–46.0)
Hemoglobin: 15.7 g/dL — ABNORMAL HIGH (ref 12.0–15.0)
Lymphocytes Relative: 34.7 % (ref 12.0–46.0)
Lymphs Abs: 3.1 10*3/uL (ref 0.7–4.0)
MCHC: 33.5 g/dL (ref 30.0–36.0)
MCV: 80.8 fl (ref 78.0–100.0)
Monocytes Absolute: 0.8 10*3/uL (ref 0.1–1.0)
Monocytes Relative: 9.2 % (ref 3.0–12.0)
Neutro Abs: 4.7 10*3/uL (ref 1.4–7.7)
Neutrophils Relative %: 52.8 % (ref 43.0–77.0)
Platelets: 247 10*3/uL (ref 150.0–400.0)
RBC: 5.78 Mil/uL — ABNORMAL HIGH (ref 3.87–5.11)
RDW: 15.7 % — ABNORMAL HIGH (ref 11.5–15.5)
WBC: 8.8 10*3/uL (ref 4.0–10.5)

## 2013-05-30 MED ORDER — ALBUTEROL SULFATE HFA 108 (90 BASE) MCG/ACT IN AERS: 2.0000 | INHALATION_SPRAY | Freq: Four times a day (QID) | RESPIRATORY_TRACT | Status: DC

## 2013-05-30 NOTE — Progress Notes (Signed)
Pre visit review using our clinic review tool, if applicable. No additional management support is needed unless otherwise documented below in the visit note. 

## 2013-05-30 NOTE — Patient Instructions (Addendum)
Your next office appointment will be determined based upon review of your pending labs & x-rays. Those instructions will be transmitted to you through My Chart  OR  by mail;whichever process is your choice to receive results & recommendations .   Uses Symbicort sample 2 puffs every 12 hours

## 2013-05-30 NOTE — Progress Notes (Signed)
Subjective:    Patient ID: Linda LeylandElsie Patton, female    DOB: 11/12/1917, 80 y.o.   MRN: [id number]  HPI  Her history is difficult to decipher if she has multisystems complaints. She has noted a knot on the right lower extremity which appeared 4-6 weeks ago. It is painful and tender. It is raised at times but can disappear intermittently. She's also noted increased swelling of the lower extremities with intermittent nonexertional cramping of the muscles of the legs, both in the calves and thighs.  The cramping is described as random. She has used rubbing alcohol without benefit.  Elevating the legs @ night does help the swelling.  She was wearing support hose but these cause some discomfort. She has a history of deep venous thrombosis in the left lower extremity.   Review of Systems  She's had exacerbation of her dyspnea. Albuterol helps but she has been using it sparingly as she had no refills of the medication.  Cough has been productive of white sputum.  She is not having significant chest pain. She has no purulent sputum.  She has had some pain in the left shoulder which radiates into the neck.   She has had some numbness and tingling in her legs as well          Objective:   Physical Exam She is very spry and appears well-nourished. She is also very vociferous wishing to share all symptoms. Breath sounds are generally decreased except for rhonchi in the right posterior thorax She has a grade 2 systolic murmur at the right base Homans sign is negative bilaterally. Dorsalis pedis pulses are decreased. There is trace edema bilaterally. No definite thrombosis is palpable although there is some asymmetry. There is slight increase in size of the ankle above the right medial malleolus. Right toenail is thickened.  Appears younger than stated age  Head: Normocephalic without obvious abnormalities Eyes: No corneal or conjunctival inflammation noted. No icterus. Ears: External   ear exam reveals no significant lesions or deformities.  Nose: External nasal exam reveals no deformity or inflammation. Nasal mucosa are pink and moist. No lesions or exudates noted.   Neck: No deformities, masses, or tenderness noted. No neck vein distention at 10 Heart: Normal rate and rhythm. Normal S1 and S2. No gallop, click, or rub Abdomen: Bowel sounds normal; abdomen soft and nontender. No masses, organomegaly or hernias noted. No hepatojugular reflux                              Musculoskeletal/extremities:  No clubbing, cyanosis noted.Tone & strength normal for age Able to lie down & sit up w/o help. Negative SLR bilaterally. Homans sign negative bilaterally Vascular: Carotid, radial artery, dorsalis pedis and  posterior tibial pulses are  equal. No bruits present. Neurologic: Alert and oriented x3. Deep tendon reflexes symmetrical and normal.  Gait slow and deliberate    Skin: Intact without suspicious lesions or rashes. Lymph: No cervical, axillary lymphadenopathy present. Psych: Mood and affect are normal. Normally interactive  Assessment & Plan:  #1 possible superficial thrombosis right lower extremity manifested as a "knot". Rule out deep venous thrombosis  #2 edema  #3 COPD, exacerbation with productive cough an asymmetric rhonchi/rales  #4 myalgia  Plan see orders and recommendations

## 2013-05-31 LAB — BASIC METABOLIC PANEL
BUN: 31 mg/dL — AB (ref 6–23)
CO2: 24 mEq/L (ref 19–32)
CREATININE: 1.9 mg/dL — AB (ref 0.4–1.2)
Calcium: 9.5 mg/dL (ref 8.4–10.5)
Chloride: 101 mEq/L (ref 96–112)
GFR: 26.8 mL/min — AB (ref >60.00)
Glucose, Bld: 267 mg/dL — ABNORMAL HIGH (ref 70–99)
Potassium: 3.9 mEq/L (ref 3.5–5.1)
Sodium: 135 mEq/L (ref 135–145)

## 2013-05-31 LAB — D-DIMER, QUANTITATIVE: D-Dimer, Quant: 1.62 ug/mL-FEU — ABNORMAL HIGH (ref 0.00–0.48)

## 2013-05-31 LAB — CK: CK TOTAL: 187 U/L — AB (ref 7–177)

## 2013-05-31 LAB — AST: AST: 30 U/L (ref 0–37)

## 2013-05-31 LAB — ALT: ALT: 26 U/L (ref 0–35)

## 2013-05-31 LAB — BRAIN NATRIURETIC PEPTIDE: Pro B Natriuretic peptide (BNP): 38 pg/mL (ref 0.0–100.0)

## 2013-06-01 ENCOUNTER — Ambulatory Visit: Payer: Medicare Other

## 2013-06-01 ENCOUNTER — Telehealth: Payer: Self-pay

## 2013-06-01 DIAGNOSIS — R7309 Other abnormal glucose: Secondary | ICD-10-CM

## 2013-06-01 LAB — HEMOGLOBIN A1C: Hgb A1c MFr Bld: 8.8 % — ABNORMAL HIGH (ref 4.6–6.5)

## 2013-06-01 NOTE — Telephone Encounter (Signed)
Request for add on A1C has been faxed to lab

## 2013-06-01 NOTE — Telephone Encounter (Signed)
Message copied by Noreene LarssonANDREWS, Antonetta Clanton R on Thu Jun 01, 2013  8:02 AM ------      Message from: Pecola LawlessHOPPER, WILLIAM F      Created: Thu Jun 01, 2013  7:43 AM       Georgiann Hahnndreasa, please add A1c ( 790.29)      Harriett SineNancy ,please see if she can be seen by me or another MD tomorrow 5/22. Thanks, Hopp ------

## 2013-06-02 ENCOUNTER — Ambulatory Visit (INDEPENDENT_AMBULATORY_CARE_PROVIDER_SITE_OTHER): Payer: Medicare Other | Admitting: Internal Medicine

## 2013-06-02 ENCOUNTER — Encounter: Payer: Self-pay | Admitting: Internal Medicine

## 2013-06-02 VITALS — BP 152/62 | HR 80 | Temp 98.0°F | Wt 140.6 lb

## 2013-06-02 DIAGNOSIS — N289 Disorder of kidney and ureter, unspecified: Secondary | ICD-10-CM

## 2013-06-02 DIAGNOSIS — E1165 Type 2 diabetes mellitus with hyperglycemia: Secondary | ICD-10-CM

## 2013-06-02 DIAGNOSIS — E162 Hypoglycemia, unspecified: Secondary | ICD-10-CM

## 2013-06-02 DIAGNOSIS — E1159 Type 2 diabetes mellitus with other circulatory complications: Secondary | ICD-10-CM

## 2013-06-02 DIAGNOSIS — E1129 Type 2 diabetes mellitus with other diabetic kidney complication: Secondary | ICD-10-CM

## 2013-06-02 NOTE — Progress Notes (Signed)
Pre visit review using our clinic review tool, if applicable. No additional management support is needed unless otherwise documented below in the visit note. 

## 2013-06-02 NOTE — Progress Notes (Signed)
Subjective:    Patient ID: [name], female    DOB: 11/15/1917, 80 y.o.   MRN: [id number]  HPI  She has had a dramatic response to Symbicort with significant improvement in her breathing. Clinically there was concern for congestive heart failure. Her BNP was only  38.  Her major concern is that she has had at least 3 hypoglycemic episodes in the past month. The last episode was last night with a blood sugar of 58 with associated symptoms. She is on 70/30 insulin 35 units in the morning and 20  units in the evening.  Her hypoglycemic episodes typically occur in the range of 1:30-2 AM. She had not been eating a bedtime snack but did try to decrease the evening not on 70/0-15 units.  She is on no specific diet and is not exercising.  Her recent labs were reviewed. Her diabetes is poorly controlled with an A1c of 8.8%  She was found to have a creatinine of 1.9; GFR was 26.8.   Review of Systems  Extrinsic symptoms of itchy, watery eyes, sneezing, or angioedema are denied. There is no significant cough, sputum production, wheezing,or  paroxysmal nocturnal dyspnea.     Objective:   Physical Exam  Gen.: Healthy and well-nourished in appearance. Very alert, appropriate and cooperative throughout exam. Appears younger than stated age  Head: Normocephalic without obvious abnormalities Eyes: No corneal or conjunctival inflammation noted. Pupils equal round reactive to light and accommodation. Extraocular motion intact. Ears: External  ear exam reveals no significant lesions or deformities.  Nose: External nasal exam reveals no deformity or inflammation. Nasal mucosa are pink and moist. No lesions or exudates noted.   Mouth: Oral mucosa and oropharynx reveal no lesions or exudates. Teeth in good repair. Neck: No deformities, masses, or tenderness noted. Thyroid normal. Lungs: Normal respiratory effort; chest expands symmetrically. Lungs are clear to auscultation without rales, wheezes, or  increased work of breathing. Heart: Normal rate and rhythm. Normal S1 and S2. No gallop, click, or rub. She has a grade 1.5 systolic murmur with neck radiation.. Abdomen: Bowel sounds normal; abdomen soft and nontender. No masses, organomegaly or hernias noted.                                Musculoskeletal/extremities: No clubbing or cyanosis noted.  She has 1+ pedal edema Vascular: Carotid, radial artery, & dorsalis pedis are full and equal. Posterior tibial pulses are decreased.No bruits present. Neurologic: Alert and oriented x3. Deep tendon reflexes symmetrical and normal.  Gait normal  including heel & toe walking . Rhomberg & finger to nose       Skin: Intact without suspicious lesions or rashes. Lymph: No cervical, axillary, or inguinal lymphadenopathy present. Psych: Mood and affect are normal. Normally interactive                                                                                         Assessment & Plan:  #1 symptomatic hypoglycemia in context of  Novolin70-30 twice a day  #2 elevated creatinine, new  #3 uncontrolled diabetes The major  goal would be to prevent hypoglycemia. A basal insulin would appear to be the best option.  Any nonessential medications or supplements should be discontinued and the creatinine monitored

## 2013-06-02 NOTE — Patient Instructions (Signed)
Levemir insulin 20 units tonight. Stop the 70/30 insulin. Each night thereafter increase Levemir to 35 units. Followup in roughly 10 days.

## 2013-06-03 DIAGNOSIS — N289 Disorder of kidney and ureter, unspecified: Secondary | ICD-10-CM | POA: Insufficient documentation

## 2013-06-06 MED ORDER — INSULIN NPH ISOPHANE & REGULAR (70-30) 100 UNIT/ML ~~LOC~~ SUSP: SUBCUTANEOUS | Status: DC

## 2013-06-06 MED ORDER — INSULIN DETEMIR 100 UNIT/ML FLEXPEN: PEN_INJECTOR | SUBCUTANEOUS | Status: DC

## 2013-06-06 NOTE — Addendum Note (Signed)
Addended by: Lyanne Co R on: 06/06/2013 08:06 AM   Modules accepted: Orders

## 2013-06-12 ENCOUNTER — Encounter: Payer: Self-pay | Admitting: Internal Medicine

## 2013-06-12 ENCOUNTER — Ambulatory Visit (INDEPENDENT_AMBULATORY_CARE_PROVIDER_SITE_OTHER): Payer: Medicare Other | Admitting: Internal Medicine

## 2013-06-12 VITALS — BP 142/56 | HR 80 | Temp 97.9°F | Wt 137.6 lb

## 2013-06-12 DIAGNOSIS — E1159 Type 2 diabetes mellitus with other circulatory complications: Secondary | ICD-10-CM

## 2013-06-12 DIAGNOSIS — J449 Chronic obstructive pulmonary disease, unspecified: Secondary | ICD-10-CM

## 2013-06-12 DIAGNOSIS — I10 Essential (primary) hypertension: Secondary | ICD-10-CM

## 2013-06-12 NOTE — Patient Instructions (Signed)
Follow a low carb nutrition program such as  The New Sugar Busters as closely as possible to prevent Diabetes progression & complications.  White carbohydrates (potatoes, rice, bread, and pasta) cause a high spike of the sugar level which stays elevated for a significant period of time (called sugar"load").  For example a  baked potato has a cup of sugar and a  french fry  2 teaspoons of sugar.  More complex carbs such as yams, wild  rice, whole grained bread &  wheat pasta have been much lower spike and persistent load of sugar than the white carbs.  Please increase the Losartan 50 mg to 1.5 pills (75 mg) daily  Followup in 6 weeks.

## 2013-06-12 NOTE — Progress Notes (Signed)
   Subjective:    Patient ID: [name], female    DOB: December 04, 1917, 80 y.o.   MRN: [id number]  HPI on 35 units of Levemir she is having no hypoglycemia. Her polyphagia has resolved. She still has some polydipsia. She denies any polyuria.  Her fasting blood sugars are averaging 109.1. Bedtime sugar average is 279.8. The fasting range is 79-146. Bedtime range is 205-379.This qhs range was similar to values on 70/30 bid ; but she was having hypoglycemia.  Her blood pressures have ranged from 135/56 -- 175/98.    Review of Systems  She's had self-limiting left upper chest and left upper posterior back pain at rest. She's had no exertional symptoms  She denies palpitations or paroxysmal nocturnal dyspnea  Her breathing remains stable on the maintenance Symbicort.     Objective:   Physical Exam  She appears well-nourished and in no distress  She appears much younger than her stated age  She is bright and alert and interactive  She has no conjunctivitis or scleral icterus  She has no lymphadenopathy about the neck or axilla  Chest: fine rales at the bases; there's no increased work of breathing  She has a grade 1.5 systolic murmur at the R base  She has no neck vein distention at 5. There is also no hepatojugular reflux  One half plus pedal edema.        Assessment & Plan:  #1 COPD with reactive airways; clinically improved  #2 diabetes, no hypoglycemia after discontinuing the bid 70/30 and change to Levemir  #3 hypertension, losartan will be changed to 50 mg 1.5 pills (75 mg) daily  Plan: No change in present diabetes therapy. Recheck 6 weeks

## 2013-06-12 NOTE — Progress Notes (Signed)
Pre visit review using our clinic review tool, if applicable. No additional management support is needed unless otherwise documented below in the visit note. 

## 2013-07-25 ENCOUNTER — Encounter: Payer: Self-pay | Admitting: Internal Medicine

## 2013-07-25 ENCOUNTER — Ambulatory Visit (INDEPENDENT_AMBULATORY_CARE_PROVIDER_SITE_OTHER): Payer: Medicare Other | Admitting: Internal Medicine

## 2013-07-25 VITALS — BP 128/52 | HR 83 | Temp 97.5°F | Wt 132.2 lb

## 2013-07-25 DIAGNOSIS — E1159 Type 2 diabetes mellitus with other circulatory complications: Secondary | ICD-10-CM

## 2013-07-25 DIAGNOSIS — J449 Chronic obstructive pulmonary disease, unspecified: Secondary | ICD-10-CM

## 2013-07-25 DIAGNOSIS — I10 Essential (primary) hypertension: Secondary | ICD-10-CM

## 2013-07-25 MED ORDER — LOSARTAN POTASSIUM 50 MG PO TABS: ORAL_TABLET | ORAL | Status: DC

## 2013-07-25 NOTE — Assessment & Plan Note (Signed)
Stable no change

## 2013-07-25 NOTE — Assessment & Plan Note (Addendum)
See glucose goals Endocrine consultation concerning pre  Prandial insulin A1c late 8/15

## 2013-07-25 NOTE — Progress Notes (Signed)
   Subjective:    Patient ID: Linda LeylandElsie Patton, female    DOB: 01/14/1917, 80 y.o.   MRN: [id number]  HPI   She returns for followup of her hypertension, diabetes, and COPD.  Blood pressure has been well controlled on losartan 75 mg daily without hypotension.  Her son is keeping a diary of her glucoses. Fasting range from 80-182. 2 hour post evening meal glucoses ranging 245-444.  She is on modified low carb diet and has lost 5 pounds.  She had one hypoglycemic event at 4 AM with a glucose of 63. On the 70-30 insulin she had significant, recurrent hypoglycemic attacks.  Her cough is improved as has her exertional dyspnea. She's now able to walk to the mailbox which is approximately 25 yards. Returning invoves slight incline which requires her to stop &  rest to catch her breath.      Review of Systems  She's developed circular lesions over the lower extremities after using "Infarub"topically.  The weight loss has been purposeful without associated fever, chills, or sweats.  She is not having purulent sputum or hemoptysis.     Objective:   Physical Exam  Significant or distinguishing  findings on physical exam are documented first.  Below that are other systems examined & findings.    She appears well-nourished much younger than her stated age. Complete dentures Grade 1.5-2 systolic murmur with neck radiation. She has mixed arthritic changes of the PIP/DIP joints. Pedal pulses are equal but decreased She has scattered erythematous circular lesions over the shins which  blanch with pressure.  General appearance is one of good health and nourishment w/o distress. Eyes: No conjunctival inflammation or scleral icterus is present. Oral exam:  lips and gums are healthy appearing.There is no oropharyngeal erythema or exudate noted.  Heart:  Normal rate and regular rhythm. S1 and S2 normal without gallop,  click, rub or other extra sounds   Lungs:Chest clear to auscultation; no  wheezes, rhonchi,rales ,or rubs present.No increased work of breathing.  Abdomen: bowel sounds normal, soft and non-tender without masses, organomegaly or hernias noted.  No guarding or rebound . Musculoskeletal: Able to lie flat and sit up without help. Gait broad & deliberate  Lymphatic: No lymphadenopathy is noted about the head, neck, axilla areas.                 Assessment & Plan:  See Current Assessment & Plan in Problem List under specific Diagnosis

## 2013-07-25 NOTE — Assessment & Plan Note (Signed)
Blood pressure goals reviewed. No change in ARB dose

## 2013-07-25 NOTE — Progress Notes (Signed)
Pre visit review using our clinic review tool, if applicable. No additional management support is needed unless otherwise documented below in the visit note. 

## 2013-07-25 NOTE — Patient Instructions (Signed)
Any low blood glucoses should be reported immediately. Monitor the glucose before eating  (fasting blood sugar) Monday, Wednesday, Friday, and Sunday. This should range from 100-150. Check glucose 2 hours after  breakfast on Tuesday; 2 hours after lunch on Thursday; and 2 hours after the meal on Saturday. This value should average  less than 180, ideally less than 160. The Diabetology referral will be scheduled and you'll be notified of the time.

## 2013-07-25 NOTE — Progress Notes (Signed)
   Subjective:    Patient ID: [name], female    DOB: 08/02/1917, 80 y.o.   MRN: [id number]  HPI #1 Diabetes status assessment: Fasting or morning glucose range average is monitored.  FBS Range 80-182  Bedtime Range 245-444 Highest glucose 2 hours after any meal is monitored at bedtime  1 episode of hypoglycemia since the switch to Levemir on 07/17/13 it was 63 and resolved with juice and part of a cookie at 4 am. No recheck of blood sugar.                                                                                      No regular exercise. She is active in the house with cleaning and cooking. Nutrition/diet followed: Cut out potatoes and white bread (Modified low carb) Medication compliance is good. Levemir 35 units No medication adverse effects noted. Eye exam current: Yes Foot care not current: Yes  A1c 8.8 06/01/2013   #2 HTN FU                                                                                Blood pressure average : 120's/60's  Compliant with anti hypertemsive medication. No lightheadedness or other adverse medication effect described.  A heart healthy /low salt diet is followed.  # 3 SOB/COPD FU  Coughing has improved. She walks to the mailbox about 25 yards with a cane without symptoms.   Review of Systems Positive for excess hunger, ankle edema, numbness and tingling in bilateral feet.  No excess thirst or excess urination reported                         No lightheadedness with standing reported No non healing skin  ulcers or sores of extremities noted. No chest pain ; palpitations ; claudication described .               No significant change in weight of pound gain pound loss. No blurred,double, or loss of vision reported. Significant headaches, epistaxis,exertional dyspnea, and paroxysmal nocturnal dyspnea absent. Objective:   Physical Exam        Assessment & Plan:

## 2013-07-26 ENCOUNTER — Telehealth: Payer: Self-pay | Admitting: Internal Medicine

## 2013-07-26 NOTE — Telephone Encounter (Signed)
Relevant patient education assigned to patient using Emmi. ° °

## 2013-08-10 ENCOUNTER — Encounter: Payer: Self-pay | Admitting: Internal Medicine

## 2013-08-10 ENCOUNTER — Ambulatory Visit (INDEPENDENT_AMBULATORY_CARE_PROVIDER_SITE_OTHER): Payer: Medicare Other | Admitting: Internal Medicine

## 2013-08-10 VITALS — BP 170/78 | HR 89 | Temp 98.0°F | Resp 12 | Ht 61.0 in | Wt 132.0 lb

## 2013-08-10 DIAGNOSIS — E1159 Type 2 diabetes mellitus with other circulatory complications: Secondary | ICD-10-CM | POA: Diagnosis not present

## 2013-08-10 MED ORDER — INSULIN NPH ISOPHANE & REGULAR (70-30) 100 UNIT/ML ~~LOC~~ SUSP: SUBCUTANEOUS | Status: DC

## 2013-08-10 NOTE — Patient Instructions (Addendum)
Please switch to the previous insulin regimen - NPH/Regular insulin: - 35 units before breakfast - 20 units before dinner Please inject the insulin 30 min before a meal. Please stop Levemir. When injecting insulin:  Inject in the abdomen  Rotate the injection sites around the belly button  Change needle for each injection  Keep needle in for 10 sec after last unit of insulin in  Keep the insulin in use out of the fridge Please return in 1 month with your sugar log.   PATIENT INSTRUCTIONS FOR TYPE 2 DIABETES:  DIET AND EXERCISE Diet and exercise is an important part of diabetic treatment.  We recommended aerobic exercise in the form of brisk walking (working between 40-60% of maximal aerobic capacity, similar to brisk walking) for 150 minutes per week (such as 30 minutes five days per week) along with 3 times per week performing 'resistance' training (using various gauge rubber tubes with handles) 5-10 exercises involving the major muscle groups (upper body, lower body and core) performing 10-15 repetitions (or near fatigue) each exercise. Start at half the above goal but build slowly to reach the above goals. If limited by weight, joint pain, or disability, we recommend daily walking in a swimming pool with water up to waist to reduce pressure from joints while allow for adequate exercise.    BLOOD GLUCOSES Monitoring your blood glucoses is important for continued management of your diabetes. Please check your blood glucoses 2-4 times a day: fasting, before meals and at bedtime (you can rotate these measurements - e.g. one day check before the 3 meals, the next day check before 2 of the meals and before bedtime, etc.).   HYPOGLYCEMIA (low blood sugar) Hypoglycemia is usually a reaction to not eating, exercising, or taking too much insulin/ other diabetes drugs.  Symptoms include tremors, sweating, hunger, confusion, headache, etc. Treat IMMEDIATELY with 15 grams of Carbs:   4 glucose  tablets    cup regular juice/soda   2 tablespoons raisins   4 teaspoons sugar   1 tablespoon honey Recheck blood glucose in 15 mins and repeat above if still symptomatic/blood glucose <100.  RECOMMENDATIONS TO REDUCE YOUR RISK OF DIABETIC COMPLICATIONS: * Take your prescribed MEDICATION(S) * Follow a DIABETIC diet: Complex carbs, fiber rich foods, (monounsaturated and polyunsaturated) fats * AVOID saturated/trans fats, high fat foods, >2,300 mg salt per day. * EXERCISE at least 5 times a week for 30 minutes or preferably daily.  * DO NOT SMOKE OR DRINK more than 1 drink a day. * Check your FEET every day. Do not wear tightfitting shoes. Contact us if you develop an ulcer * See your EYE doctor once a year or more if needed * Get a FLU shot once a year * Get a PNEUMONIA vaccine once before and once after age 78 years  GOALS:  * Your Hemoglobin A1c of <7%  * fasting sugars need to be <130 * after meals sugars need to be <180 (2h after you start eating) * Your Systolic BP should be 140 or lower  * Your Diastolic BP should be 80 or lower  * Your HDL (Good Cholesterol) should be 40 or higher  * Your LDL (Bad Cholesterol) should be 100 or lower. * Your Triglycerides should be 150 or lower  * Your Urine microalbumin (kidney function) should be <30 * Your Body Mass Index should be 25 or lower   We will be glad to help you achieve these goals. Our telephone number is: [phone].

## 2013-08-10 NOTE — Progress Notes (Signed)
Patient ID: [name], female   DOB: 08/09/1917, 80 y.o.   MRN: [id number]  HPI: [name] is a 80 y.o.-year-old female, referred by her PCP, Dr. Alwyn RenHopper, for management of DM2, insulin-dependent, uncontrolled, with complications (CKD, PN).  Patient has been diagnosed with diabetes in 1970s; she started insulin soon after dx. Last hemoglobin A1c was: Lab Results  Component Value Date   HGBA1C 8.8* 06/01/2013   HGBA1C 8.4* 01/19/2012   HGBA1C 8.6* 01/14/2011   Pt was on a regimen of: - Insulin NPH/R 70/30 35 units in am and 20 units at bedtime  She is now on (started 06/2013): - Levemir 35 units at bedtime   Pt checks her sugars 5-6x a day and they are: - am: 76-153 - Later: 200-300-400 Has lows in am. Lowest sugar was 55.  she has hypoglycemia awareness at 65-70.  Highest sugar was 400s  Pt's meals are: - Breakfast: oatmeal, sausage or bacon, eggs, grits, cereal - Lunch: veggies - Dinner: meat + veggies - Snacks: none She cut down on bread >> lost 10 lbs (in 1.5 mo)  - + CKD, last BUN/creatinine:  Lab Results  Component Value Date   BUN 31* 05/30/2013   CREATININE 1.9* 05/30/2013  On Cozaar. - last set of lipids: Lab Results  Component Value Date   CHOL 169 12/26/2009   HDL 41.30 12/26/2009   LDLCALC 70 01/28/2009   LDLDIRECT 92.1 12/26/2009   TRIG 218.0* 12/26/2009   CHOLHDL 4 12/26/2009   - last eye exam was in 2014. No DR.  - + numbness and tingling in her feet. Feet hurt at night.  Pt has FH of DM in 2 sons.  ROS: Constitutional: + weight loss, no fatigue, no subjective hyperthermia/hypothermia, + poor sleep, + nocturia Eyes: no blurry vision, no xerophthalmia ENT: no sore throat, no nodules palpated in throat, no dysphagia/odynophagia, + hoarseness, + decreased hearing Cardiovascular: no CP/+ SOB/+ palpitations/+ leg swelling Respiratory: + all: cough/SOB/wheezing Gastrointestinal: no N/V/+ D/+ C/+ heartburn Musculoskeletal: + all:  muscle/joint  aches Skin:+ rash - legs, easy bruising, + hair loss Neurological: + tremors/+ numbness/+ tingling/dizziness Psychiatric: no depression/+ anxiety  Past Medical History  Diagnosis Date  . CATARACT EXTRACTION, HX OF 09/21/2006  . COLONIC POLYPS, HX OF 09/21/2006  . COPD 09/21/2006  . DIABETES MELLITUS 09/21/2006  . DVT 09/21/2006  . Headache(784.0) 06/13/2007  . HYPERLIPIDEMIA 09/21/2006  . HYPERTENSION 09/21/2006  . HYPOTHYROIDISM 09/21/2006  . HYSTERECTOMY, HX OF 09/21/2006  . NEURALGIA, TRIGEMINAL 10/01/2008  . OSTEOPOROSIS 09/21/2006  . PLANTAR FASCIITIS 09/21/2006  . PRIMARY LOCALIZED OSTEOARTHROSIS HAND 06/13/2007  . THYROIDECTOMY, HX OF 09/21/2006   Past Surgical History  Procedure Laterality Date  . Abdominal hysterectomy    . Cataract extraction    . Thyroidectomy     History   Social History  . Marital Status: Widowed    Spouse Name: N/A    Number of Children: 4  . Years of Education: 4th   Occupational History  . retired    Social History Main Topics  . Smoking status: Former Smoker    Quit date: 01/14/1979  . Smokeless tobacco: Never Used  . Alcohol Use: No  . Drug Use: No   Social History Narrative   4th grade. Married '44 -'50 yrs/Widowed. 3 sons -'8340, '46, '51, ; 2 dtrs ' 47, one stillborn. Work - Theatre managerfurniture mfg, [employer], Production assistant, radioegg grader. Lives alone, very independent, continues to garden, does all her own housework, avid reader.  End of life care (01/28/09) in a discussion about the recent death sisiter she is very clear that she does not want CPR or intubation or any heroic measures. Will send a packet with out of facility order, "[employer]'s Guide" most form.   Current Outpatient Prescriptions on File Prior to Visit  Medication Sig Dispense Refill  . albuterol (PROVENTIL HFA;VENTOLIN HFA) 108 (90 BASE) MCG/ACT inhaler Inhale 2 puffs into the lungs 4 (four) times daily.  1 Inhaler  1  . amitriptyline (ELAVIL) 25 MG tablet take 1 to 2 tablets by mouth at bedtime  60 tablet  11   . amLODipine (NORVASC) 10 MG tablet Take 1 tablet (10 mg total) by mouth daily.  30 tablet  11  . aspirin 81 MG tablet Take 81 mg by mouth 3 (three) times daily.       . calcium-vitamin D (OSCAL WITH D) 500-200 MG-UNIT per tablet Take 1 tablet by mouth daily.        . diclofenac sodium (VOLTAREN) 1 % GEL Apply 2 g topically 4 (four) times daily.  100 g  3  . fish oil-omega-3 fatty acids 1000 MG capsule Take 1 g by mouth daily.        . Fluticasone-Salmeterol (ADVAIR DISKUS) 250-50 MCG/DOSE AEPB Inhale 1 puff into the lungs 2 (two) times daily.  60 each  12  . furosemide (LASIX) 40 MG tablet take 1 tablet by mouth once daily  30 tablet  11  . Insulin Syringe-Needle U-100 (B-D INS SYRINGE 0.5CC/31GX5/16) 31G X 5/16" 0.5 ML MISC Use to administer insulin twice a day Dx 250.00  100 each  2  . levothyroxine (SYNTHROID, LEVOTHROID) 100 MCG tablet take 1 tablet by mouth once daily  30 tablet  11  . losartan (COZAAR) 50 MG tablet 1&1/2 qd  135 tablet  1  . Multiple Vitamins-Minerals (MULTIVITAMIN WITH MINERALS) tablet Take 1 tablet by mouth daily.        . ONE TOUCH ULTRA TEST test strip USE AS DIRECTED TO TEST BLOOD SUGAR TWICE DAILY  100 each  11  . Probiotic Product (ALIGN) 4 MG CAPS Take 1 capsule by mouth daily.      Marland Kitchen Spacer/Aero-Holding Chambers (AEROCHAMBER PLUS FLO-VU MEDIUM) MISC 1 each by Other route once.  1 each  0  . vitamin E 400 UNIT capsule Take 400 Units by mouth daily.         No current facility-administered medications on file prior to visit.   Allergies  Allergen Reactions  . Codeine Nausea And Vomiting   Fh: SEE hpi  PE: BP 170/78  Pulse 89  Temp(Src) 98 F (36.7 C) (Oral)  Resp 12  Ht 5\' 1"  (1.549 m)  Wt 132 lb (59.875 kg)  BMI 24.95 kg/m2  SpO2 96% Wt Readings from Last 3 Encounters:  08/10/13 132 lb (59.875 kg)  07/25/13 132 lb 3.2 oz (59.966 kg)  06/12/13 137 lb 9.6 oz (62.415 kg)   Constitutional:  Normal weight, in NAD, l;ooking younger than stated  age Eyes: PERRLA, EOMI, no exophthalmos ENT: moist mucous membranes, no thyromegaly, no cervical lymphadenopathy Cardiovascular: RRR, No MRG Respiratory: CTA B Gastrointestinal: abdomen soft, NT, ND, BS+ Musculoskeletal: no deformities except finger arthritis, strength intact in all 4 Skin: moist, warm, + rash in legs - macular, maculae 0.5 cm diametes - shins. Neurological: no tremor with outstretched hands, DTR normal in all 4  ASSESSMENT: 1. DM2, non-insulin-dependent, uncontrolled, without complications  PLAN:  1. Patient with long-standing,  uncontrolled diabetes, on only basal insulin now, with increasing insulin throughout the day due to lack of mealtime coverage. She was on 70/30 insulin before, but was taking the second dose at bedtime >> lows during the night. A basal-bolus regimen would yield better control than a premixed regimen, but, due to age, we will try to make the regimen as comfortable for her as possible.  - We discussed about options for treatment, and I suggested to:  Patient Instructions  Please switch to the previous insulin regimen - NPH/Regular insulin: - 35 units before breakfast - 20 units before dinner Please inject the insulin 30 min before a meal. Please stop Levemir. When injecting insulin:  Inject in the abdomen  Rotate the injection sites around the belly button  Change needle for each injection  Keep needle in for 10 sec after last unit of insulin in  Keep the insulin in use out of the fridge Please return in 1 month with your sugar log.  - Strongly advised her to start checking sugars at different times of the day - check 3-4 times a day, rotating checks - given sugar log and advised how to fill it and to bring it at next appt  - given foot care handout and explained the principles  - given instructions for hypoglycemia management "15-15 rule"  - advised for yearly eye exams >> she is UTD - Return to clinic in 1 mo with sugar log

## 2013-09-19 ENCOUNTER — Ambulatory Visit: Payer: Medicare Other | Admitting: Internal Medicine

## 2013-09-22 ENCOUNTER — Encounter: Payer: Self-pay | Admitting: Internal Medicine

## 2013-09-22 ENCOUNTER — Ambulatory Visit (INDEPENDENT_AMBULATORY_CARE_PROVIDER_SITE_OTHER): Payer: Medicare Other | Admitting: Internal Medicine

## 2013-09-22 VITALS — BP 126/64 | HR 97 | Temp 98.3°F | Resp 12 | Wt 135.0 lb

## 2013-09-22 DIAGNOSIS — E1159 Type 2 diabetes mellitus with other circulatory complications: Secondary | ICD-10-CM

## 2013-09-22 LAB — HEMOGLOBIN A1C: Hgb A1c MFr Bld: 8.7 % — ABNORMAL HIGH (ref 4.6–6.5)

## 2013-09-22 NOTE — Progress Notes (Signed)
Patient ID: [name], female   DOB: 1917/06/05, 80 y.o.   MRN: [id number]  HPI: [name] is a 80 y.o.-year-old female, returning for f/u for DM2, dx 1970, insulin-dependent soon after dx, uncontrolled, with complications (CKD, PN).  Last hemoglobin A1c was: Lab Results  Component Value Date   HGBA1C 8.8* 06/01/2013   HGBA1C 8.4* 01/19/2012   HGBA1C 8.6* 01/14/2011   Pt is on a regimen of: - Insulin 70/30 NPH/Regular insulin: - 35 units before breakfast - 20 units before dinner At last visit, we stopped Levemir to restart her 70/30 insulin.  Pt checks her sugars 5-6x a day and they are: - am: 76-153 >> 99-130s (160x3) - 2h after b'fast: 127-244, most <180) - lunch: 73, 88, 112-156 (207) - 2h after lunch: 160-233, 291 - dinner: 55, x1, 92-160 - 2h after dinner: 123-269 - bedtime: 120-250, mostly 100s - nighttime: 56, 62 Has lows in am. Lowest sugar was 55.  she has hypoglycemia awareness at 65-70.  Highest sugar was 280.  Pt's meals are: - Breakfast: oatmeal, sausage or bacon, eggs, grits, cereal - Lunch: veggies - Dinner: meat + veggies - Snacks: at bedtime if sugars <200  - + CKD, last BUN/creatinine:  Lab Results  Component Value Date   BUN 31* 05/30/2013   CREATININE 1.9* 05/30/2013  On Cozaar. - last set of lipids: Lab Results  Component Value Date   CHOL 169 12/26/2009   HDL 41.30 12/26/2009   LDLCALC 70 01/28/2009   LDLDIRECT 92.1 12/26/2009   TRIG 218.0* 12/26/2009   CHOLHDL 4 12/26/2009   - last eye exam was in 2014. No DR. No further exams necessary. - + numbness and tingling in her feet. Feet hurt at night.  I reviewed pt's medications, allergies, PMH, social hx, family hx and no changes required, except as mentioned above.  ROS: Constitutional: no weight loss, + fatigue, no subjective hyperthermia/hypothermia, + nocturia Eyes: no blurry vision, no xerophthalmia ENT: no sore throat, no nodules palpated in throat, no dysphagia/odynophagia, +  hoarseness, + decreased hearing Cardiovascular: no CP/+ SOB/no palpitations/+ leg swelling Respiratory: + all: cough/SOB/wheezing Gastrointestinal: no N/V/+ D/no C/heartburn Musculoskeletal: no muscle aches/+ joint aches Skin:+ rash - legs, easy bruising, + hair loss, + itching Neurological: + tremors/no numbness/tingling/dizziness, + HA  PE: BP 126/64  Pulse 97  Temp(Src) 98.3 F (36.8 C) (Oral)  Resp 12  Wt 135 lb (61.236 kg)  SpO2 95% Wt Readings from Last 3 Encounters:  09/22/13 135 lb (61.236 kg)  08/10/13 132 lb (59.875 kg)  07/25/13 132 lb 3.2 oz (59.966 kg)   Constitutional:  Normal weight, in NAD, l;ooking younger than stated age Eyes: PERRLA, EOMI, no exophthalmos ENT: moist mucous membranes, no thyromegaly, no cervical lymphadenopathy Cardiovascular: RRR, + 2/6 SEM, no RG Respiratory: CTA B Gastrointestinal: abdomen soft, NT, ND, BS+ Musculoskeletal: no deformities except finger arthritis, strength intact in all 4 Skin: moist, warm, + rash in legs - macular, maculae 0.5 cm diametes - shins. Neurological: no tremor with outstretched hands, DTR normal in all 4  ASSESSMENT: 1. DM2, insulin-dependent, uncontrolled, with complications - CKD - PN  PLAN:  1. Patient with long-standing, uncontrolled diabetes, on 70/30 insulin now. She has some lows at night, if the sugars at bedtime are in the 200s, but not if they are lower. She tells me she eats a snack at bedtime if sugars <200. Ideally, we would move the NPH closer to bedtime while leaving the regular before dinner, but this would complicate  the regimen further and she would not like to do this. I therefore advised her to get a small bedtime snack even if >200 at bedtime. A basal-bolus regimen would yield better control than a premixed regimen, but, due to age, we will try to make the regimen as comfortable for her as possible.  -  I suggested to:  Patient Instructions  Please continue previous NPH/Regular insulin: -  35 units before breakfast - 20 units before dinner Please inject the insulin 30 min before a meal. Please return in 3 month with your sugar log.  - continue checking sugars at different times of the day - check 3-4 times a day, rotating checks - advised for yearly eye exams >> she is UTD - will check A1c today - Return to clinic in 3 mo with sugar log   Office Visit on 09/22/2013  Component Date Value Ref Range Status  . Hemoglobin A1C 09/22/2013 8.7* 4.6 - 6.5 % Final   Glycemic Control Guidelines for People with Diabetes:Non Diabetic:  <6%Goal of Therapy: <7%Additional Action Suggested:  >8%    HbA1c still higher than target (I would aim for <8% for her), but, due to age, would put more accent on her comfort and avoiding very low and very high sugars.

## 2013-09-22 NOTE — Patient Instructions (Signed)
Please continue previous NPH/Regular insulin: - 35 units before breakfast - 20 units before dinner Please inject the insulin 30 min before a meal. Please return in 3 month with your sugar log.  Please stop at the lab.

## 2013-11-07 ENCOUNTER — Other Ambulatory Visit: Payer: Self-pay

## 2013-11-07 MED ORDER — AMLODIPINE BESYLATE 10 MG PO TABS: 10.0000 mg | ORAL_TABLET | Freq: Every day | ORAL | Status: DC

## 2013-12-21 ENCOUNTER — Ambulatory Visit (INDEPENDENT_AMBULATORY_CARE_PROVIDER_SITE_OTHER): Payer: Medicare Other | Admitting: Internal Medicine

## 2013-12-21 ENCOUNTER — Encounter: Payer: Self-pay | Admitting: Internal Medicine

## 2013-12-21 ENCOUNTER — Other Ambulatory Visit: Payer: Self-pay | Admitting: Internal Medicine

## 2013-12-21 VITALS — BP 104/64 | HR 95 | Temp 97.9°F | Resp 12 | Wt 136.0 lb

## 2013-12-21 DIAGNOSIS — E1140 Type 2 diabetes mellitus with diabetic neuropathy, unspecified: Secondary | ICD-10-CM

## 2013-12-21 DIAGNOSIS — E1142 Type 2 diabetes mellitus with diabetic polyneuropathy: Secondary | ICD-10-CM

## 2013-12-21 LAB — HEMOGLOBIN A1C
Hgb A1c MFr Bld: 8 % — ABNORMAL HIGH (ref <5.7)
Mean Plasma Glucose: 183 mg/dL — ABNORMAL HIGH (ref <117)

## 2013-12-21 NOTE — Patient Instructions (Signed)
Patient Instructions  Please continue previous NPH/Regular insulin: - 35 units before breakfast - 20 units before dinner Please inject the insulin 30 min before a meal. Try to get a glass of 2% milk every night at bedtime. Please return in 3 month with your sugar log.  [facility].

## 2013-12-21 NOTE — Progress Notes (Signed)
Patient ID: [name], female   DOB: 10/03/1917, 80 y.o.   MRN: [id number]  HPI: [name] is a 80 y.o.-year-old female, returning for f/u for DM2, dx 1970, insulin-dependent soon after dx, uncontrolled, with complications (CKD, PN). Last visit 3 mo ago.  Last hemoglobin A1c was: Lab Results  Component Value Date   HGBA1C 8.7* 09/22/2013   HGBA1C 8.8* 06/01/2013   HGBA1C 8.4* 01/19/2012   Pt is on a regimen of: - Insulin 70/30 NPH/Regular insulin: - 35 units before breakfast - 20 units before dinner At last visit, we stopped Levemir to restart her 70/30 insulin. She prefers to stay on this regimen.  Pt checks her sugars 5-6x a day and they are: - am: 76-153 >> 99-130s (160x3) >> 91-147, 182 - 2h after b'fast: 127-244, most <180) >> 130-198, 319 (forgot insulin) - lunch: 73, 88, 112-156 (207) >> 63-140, 329 - 2h after lunch: 160-233, 291 >> 68-221, 251 - dinner: 55, x1, 92-160 >> 58, 86-190, 210 - 2h after dinner: 123-269 >> 117-251 - bedtime: 120-250, mostly 100s >> 115-225, 244, 266 (eats a small snack if bedtime sugar not >200) - nighttime: 56, 62 >> 55-103 Has lows in am. Lowest sugar was 55.  she has hypoglycemia awareness at 65-70.  Highest sugar was 329.  Pt's meals are: - Breakfast: oatmeal, sausage or bacon, eggs, grits, cereal - Lunch: veggies - Dinner: meat + veggies - Snacks: at bedtime if sugars <200  - + CKD, last BUN/creatinine:  Lab Results  Component Value Date   BUN 31* 05/30/2013   CREATININE 1.9* 05/30/2013  On Cozaar. - last set of lipids: Lab Results  Component Value Date   CHOL 169 12/26/2009   HDL 41.30 12/26/2009   LDLCALC 70 01/28/2009   LDLDIRECT 92.1 12/26/2009   TRIG 218.0* 12/26/2009   CHOLHDL 4 12/26/2009   - last eye exam was in 2014. No DR. No further exams necessary. - + numbness and tingling in her feet. Feet hurt at night.  I reviewed pt's medications, allergies, PMH, social hx, family hx and no changes required, except as  mentioned above.  ROS: Constitutional: no weight loss, + fatigue, no subjective hyperthermia/hypothermia Eyes: no blurry vision, no xerophthalmia ENT: no sore throat, no nodules palpated in throat, no dysphagia/odynophagia, no hoarseness, + decreased hearing Cardiovascular: no CP/SOB/no palpitations/+ leg swelling Respiratory: no cough/SOB/wheezing Gastrointestinal: no N/V/D/C/heartburn Musculoskeletal: no muscle aches/joint aches Skin:no rash  Neurological: no tremors/no numbness/tingling/dizziness  PE: BP 104/64 mmHg  Pulse 95  Temp(Src) 97.9 F (36.6 C) (Oral)  Resp 12  Wt 136 lb (61.689 kg)  SpO2 96% Body mass index is 25.71 kg/(m^2). Wt Readings from Last 3 Encounters:  12/21/13 136 lb (61.689 kg)  09/22/13 135 lb (61.236 kg)  08/10/13 132 lb (59.875 kg)   Constitutional:  Normal weight, in NAD, l;ooking younger than stated age Eyes: PERRLA, EOMI, no exophthalmos ENT: moist mucous membranes, no thyromegaly, no cervical lymphadenopathy Cardiovascular: RRR, + 2/6 SEM, no RG, + periankle edema Respiratory: CTA B Gastrointestinal: abdomen soft, NT, ND, BS+ Musculoskeletal: no deformities except finger arthritis, strength intact in all 4 Skin: moist, warm Neurological: no tremor with outstretched hands, DTR normal in all 4  ASSESSMENT: 1. DM2, insulin-dependent, uncontrolled, with complications - CKD - PN  PLAN:  1. Patient with long-standing, uncontrolled diabetes, on 70/30 insulin now. She has mild lows at night, needs to get a small snack. She tells me she eats a snack at bedtime if sugars <200. Ideally, we  would move the NPH closer to bedtime while leaving the regular before dinner, but this would complicate the regimen further and she would not like to do this. I therefore advised her (again) to get a small bedtime snack (milk) even if >200 at bedtime. A basal-bolus regimen would yield better control than a premixed regimen, but, due to age, we will try to make the  regimen as comfortable for her as possible.  -  I suggested to:  Patient Instructions  Please continue previous NPH/Regular insulin: - 35 units before breakfast - 20 units before dinner Please inject the insulin 30 min before a meal. Try to get a glass of 2% milk every night at bedtime. Please return in 3 month with your sugar log.  [facility].  - continue checking sugars at different times of the day - check 3 times a day, rotating checks - no need for further eye exams  - will check A1c today - refuses flu vaccine ("because of my lungs") - Return to clinic in 3 mo with sugar log   Orders Only on 12/21/2013  Component Date Value Ref Range Status  . Hgb A1c MFr Bld 12/21/2013 8.0* <5.7 % Final   Comment:                                                                        According to the ADA Clinical Practice Recommendations for 2011, when HbA1c is used as a screening test:     >=6.5%   Diagnostic of Diabetes Mellitus            (if abnormal result is confirmed)   5.7-6.4%   Increased risk of developing Diabetes Mellitus   References:Diagnosis and Classification of Diabetes Mellitus,Diabetes Care,2011,34(Suppl 1):S62-S69 and Standards of Medical Care in         Diabetes - 2011,Diabetes Care,2011,34 (Suppl 1):S11-S61.     . Mean Plasma Glucose 12/21/2013 183* <117 mg/dL Final   [id number] is better!This is actually at target for her 2/2 age.

## 2014-01-08 ENCOUNTER — Other Ambulatory Visit: Payer: Self-pay

## 2014-01-08 MED ORDER — FUROSEMIDE 40 MG PO TABS: 40.0000 mg | ORAL_TABLET | Freq: Every day | ORAL | Status: DC

## 2014-01-23 ENCOUNTER — Other Ambulatory Visit (INDEPENDENT_AMBULATORY_CARE_PROVIDER_SITE_OTHER): Payer: Medicare Other

## 2014-01-23 ENCOUNTER — Ambulatory Visit (INDEPENDENT_AMBULATORY_CARE_PROVIDER_SITE_OTHER): Payer: Medicare Other | Admitting: Internal Medicine

## 2014-01-23 ENCOUNTER — Encounter: Payer: Self-pay | Admitting: Internal Medicine

## 2014-01-23 VITALS — BP 154/60 | HR 75 | Temp 98.1°F | Resp 18 | Ht 61.0 in | Wt 136.0 lb

## 2014-01-23 DIAGNOSIS — Z86010 Personal history of colonic polyps: Secondary | ICD-10-CM

## 2014-01-23 DIAGNOSIS — E890 Postprocedural hypothyroidism: Secondary | ICD-10-CM

## 2014-01-23 DIAGNOSIS — E1159 Type 2 diabetes mellitus with other circulatory complications: Secondary | ICD-10-CM

## 2014-01-23 DIAGNOSIS — I10 Essential (primary) hypertension: Secondary | ICD-10-CM

## 2014-01-23 DIAGNOSIS — E1151 Type 2 diabetes mellitus with diabetic peripheral angiopathy without gangrene: Secondary | ICD-10-CM

## 2014-01-23 LAB — CBC WITH DIFFERENTIAL/PLATELET
Basophils Absolute: 0.1 10*3/uL (ref 0.0–0.1)
Basophils Relative: 0.6 % (ref 0.0–3.0)
EOS PCT: 2.7 % (ref 0.0–5.0)
Eosinophils Absolute: 0.3 10*3/uL (ref 0.0–0.7)
HCT: 47.6 % — ABNORMAL HIGH (ref 36.0–46.0)
Hemoglobin: 15.6 g/dL — ABNORMAL HIGH (ref 12.0–15.0)
LYMPHS PCT: 30.9 % (ref 12.0–46.0)
Lymphs Abs: 3.2 10*3/uL (ref 0.7–4.0)
MCHC: 32.8 g/dL (ref 30.0–36.0)
MCV: 82.9 fl (ref 78.0–100.0)
MONO ABS: 0.8 10*3/uL (ref 0.1–1.0)
Monocytes Relative: 7.7 % (ref 3.0–12.0)
NEUTROS PCT: 58.1 % (ref 43.0–77.0)
Neutro Abs: 6 10*3/uL (ref 1.4–7.7)
Platelets: 244 10*3/uL (ref 150.0–400.0)
RBC: 5.74 Mil/uL — ABNORMAL HIGH (ref 3.87–5.11)
RDW: 15 % (ref 11.5–15.5)
WBC: 10.4 10*3/uL (ref 4.0–10.5)

## 2014-01-23 LAB — BASIC METABOLIC PANEL
BUN: 25 mg/dL — ABNORMAL HIGH (ref 6–23)
CO2: 22 meq/L (ref 19–32)
CREATININE: 1.6 mg/dL — AB (ref 0.4–1.2)
Calcium: 9.5 mg/dL (ref 8.4–10.5)
Chloride: 104 mEq/L (ref 96–112)
GFR: 31.16 mL/min — AB (ref >60.00)
GLUCOSE: 202 mg/dL — AB (ref 70–99)
Potassium: 3.8 mEq/L (ref 3.5–5.1)
Sodium: 136 mEq/L (ref 135–145)

## 2014-01-23 LAB — TSH: TSH: 2.5 u[IU]/mL (ref 0.35–4.50)

## 2014-01-23 NOTE — Assessment & Plan Note (Signed)
CBC & dif 

## 2014-01-23 NOTE — Assessment & Plan Note (Signed)
As per Dr Gherghe 

## 2014-01-23 NOTE — Progress Notes (Signed)
Pre visit review using our clinic review tool, if applicable. No additional management support is needed unless otherwise documented below in the visit note. 

## 2014-01-23 NOTE — Patient Instructions (Addendum)
Your next office appointment will be determined based upon review of your pending labs . Those instructions will be transmitted to you through My Chart      Minimal Blood Pressure Goal= AVERAGE < 140/90;  Ideal is an AVERAGE < 135/85. This AVERAGE should be calculated from @ least 5-7 BP readings taken @ different times of day on different days of week. You should not respond to isolated BP readings , but rather the AVERAGE for that week .Please bring your  blood pressure cuff to office visits to verify that it is reliable.It  can also be checked against the blood pressure device at the pharmacy. Finger or wrist cuffs are not dependable; an arm cuff is.  Wear a wrist splint overnight on the hand which is most affected. If this is effective; wear one bilaterally.

## 2014-01-23 NOTE — Progress Notes (Signed)
   Subjective:    Patient ID: [name], female    DOB: 02/13/1917, 81 y.o.   MRN: [id number]  HPI  She is here to assess status of active health conditions.  PMH, FH, & Social History reviewed & updated. She monitors her blood pressure at home; unfortunately, she forgot to bring the recordings. She does restrict sodium  She's physically active doing housework; she has no defined exercise program.  Her COPD associated DOE  is stable;no significant cough.  She does have a past history of colonic polyps. She has aged out of colonoscopic survelliance. She has no active GI symptoms.  DM monitored by Dr Elvera LennoxGherghe.Last A1c was 8% on 12/21/13.  Review of Systems  She awakens with numbness in her hands in am ; she thought this was circulatory related.  Chest pain, palpitations, tachycardia,  paroxysmal nocturnal dyspnea, claudication or edema are absent.  Unexplained weight loss, abdominal pain, significant dyspepsia, dysphagia, melena, rectal bleeding, or persistently small caliber stools are denied.       Objective:   Physical Exam Positive or pertinent findings include:  She has hearing aids bilaterally. There is a slight accumulation of wax on the left without occlusion. Thyroid tissue is not palpable. She has a grade 2 systolic murmur. There is radiation of the murmur into the right carotid greater than the left versus a bruit There is slight accentuation of the thoracic curvature. She has mild crepitus of the knee The posterior tibial pulses are decreased. The dorsalis pedis pulses are palpable. She has trace pedal edema.   General appearance :adequately nourished; in no distress.Appears younger than stated age Eyes: No conjunctival inflammation or scleral icterus is present. Oral exam:  Lips and gums are healthy appearing.There is no oropharyngeal erythema or exudate noted.  Heart:  Normal rate and regular rhythm. S1 and S2 normal without gallop, click, rub or other extra  sounds   Lungs:Chest clear to auscultation; no wheezes, rhonchi,rales ,or rubs present.No increased work of breathing.  Abdomen: bowel sounds normal, soft and non-tender without masses, organomegaly or hernias noted.  No guarding or rebound.  Vascular : all pulses equal  Skin:Warm & dry.  Intact without suspicious lesions or rashes ; no jaundice or tenting Lymphatic: No lymphadenopathy is noted about the head, neck, axilla          Assessment & Plan:  See Current Assessment & Plan in Problem List under specific Diagnosis

## 2014-01-23 NOTE — Assessment & Plan Note (Signed)
TSH 

## 2014-01-23 NOTE — Assessment & Plan Note (Signed)
Blood pressure goals reviewed. BMET 

## 2014-02-05 ENCOUNTER — Telehealth: Payer: Self-pay | Admitting: Internal Medicine

## 2014-02-05 ENCOUNTER — Other Ambulatory Visit: Payer: Self-pay

## 2014-02-05 DIAGNOSIS — I10 Essential (primary) hypertension: Secondary | ICD-10-CM

## 2014-02-05 MED ORDER — LOSARTAN POTASSIUM 50 MG PO TABS: ORAL_TABLET | ORAL | Status: DC

## 2014-02-05 MED ORDER — LOSARTAN POTASSIUM 50 MG PO TABS
ORAL_TABLET | ORAL | Status: DC
Start: 2014-02-05 — End: 2015-01-29

## 2014-02-05 NOTE — Telephone Encounter (Signed)
Refills sent to [facility].../lmb 

## 2014-02-05 NOTE — Telephone Encounter (Signed)
Pt son called in and pt needs refill on her losartan (COZAAR) 50 MG tablet [[id number]][[id number]]    [facility] on [address] [phone]

## 2014-02-19 ENCOUNTER — Other Ambulatory Visit: Payer: Self-pay

## 2014-02-19 MED ORDER — LEVOTHYROXINE SODIUM 100 MCG PO TABS: 100.0000 ug | ORAL_TABLET | Freq: Every day | ORAL | Status: DC

## 2014-03-07 ENCOUNTER — Other Ambulatory Visit: Payer: Self-pay

## 2014-03-07 NOTE — Telephone Encounter (Signed)
To protect you ; the Amitriptyline must be weaned . It is a very dangerous agent @ your age as it can impact level of alertness & also may have cardiac risks. For the next month decrease this medication to 25 mg daily  25 mg #30

## 2014-03-07 NOTE — Telephone Encounter (Signed)
Pt last seen 11/14/14

## 2014-03-08 MED ORDER — AMITRIPTYLINE HCL 25 MG PO TABS: ORAL_TABLET | ORAL | Status: DC

## 2014-03-09 ENCOUNTER — Telehealth: Payer: Self-pay

## 2014-03-09 NOTE — Telephone Encounter (Signed)
Pt declines the flu vaccine

## 2014-03-16 ENCOUNTER — Other Ambulatory Visit: Payer: Self-pay

## 2014-03-16 MED ORDER — GLUCOSE BLOOD VI STRP: ORAL_STRIP | Status: DC

## 2014-05-01 ENCOUNTER — Telehealth: Payer: Self-pay

## 2014-05-01 NOTE — Telephone Encounter (Signed)
Call to patient with Son's number; Mr. [name] answered and had placed call to him regarding his AWV; He is caregiver to X spouse and mother; the patient. Stated the patient had AWV in January. Again, discussed program and invited Mr. [name] to call if he had time to make an apt and he will do so. Has not rec'd information sent out regarding caregiver groups and community assistance, but will look for it.

## 2014-05-03 ENCOUNTER — Other Ambulatory Visit: Payer: Self-pay

## 2014-05-03 MED ORDER — AMLODIPINE BESYLATE 10 MG PO TABS: 10.0000 mg | ORAL_TABLET | Freq: Every day | ORAL | Status: DC

## 2014-05-07 ENCOUNTER — Other Ambulatory Visit: Payer: Self-pay

## 2014-05-07 MED ORDER — INSULIN NPH ISOPHANE & REGULAR (70-30) 100 UNIT/ML ~~LOC~~ SUSP: SUBCUTANEOUS | Status: DC

## 2014-06-05 ENCOUNTER — Other Ambulatory Visit: Payer: Self-pay

## 2014-06-05 DIAGNOSIS — J441 Chronic obstructive pulmonary disease with (acute) exacerbation: Secondary | ICD-10-CM

## 2014-06-05 MED ORDER — ALBUTEROL SULFATE HFA 108 (90 BASE) MCG/ACT IN AERS: 2.0000 | INHALATION_SPRAY | Freq: Four times a day (QID) | RESPIRATORY_TRACT | Status: DC

## 2014-07-09 ENCOUNTER — Other Ambulatory Visit: Payer: Self-pay

## 2014-07-11 ENCOUNTER — Other Ambulatory Visit: Payer: Self-pay | Admitting: Emergency Medicine

## 2014-07-11 MED ORDER — FUROSEMIDE 40 MG PO TABS: 40.0000 mg | ORAL_TABLET | Freq: Every day | ORAL | Status: DC

## 2014-08-21 ENCOUNTER — Other Ambulatory Visit: Payer: Self-pay

## 2014-08-21 MED ORDER — LEVOTHYROXINE SODIUM 100 MCG PO TABS: 100.0000 ug | ORAL_TABLET | Freq: Every day | ORAL | Status: DC

## 2014-09-24 ENCOUNTER — Other Ambulatory Visit: Payer: Self-pay | Admitting: Emergency Medicine

## 2014-09-24 MED ORDER — LEVOTHYROXINE SODIUM 100 MCG PO TABS: 100.0000 ug | ORAL_TABLET | Freq: Every day | ORAL | Status: DC

## 2014-10-17 ENCOUNTER — Other Ambulatory Visit: Payer: Self-pay | Admitting: Emergency Medicine

## 2014-10-17 MED ORDER — "INSULIN SYRINGE-NEEDLE U-100 31G X 5/16"" 0.5 ML MISC": Status: DC

## 2014-10-22 ENCOUNTER — Other Ambulatory Visit: Payer: Self-pay | Admitting: Emergency Medicine

## 2014-10-22 MED ORDER — LEVOTHYROXINE SODIUM 100 MCG PO TABS: 100.0000 ug | ORAL_TABLET | Freq: Every day | ORAL | Status: DC

## 2014-11-09 ENCOUNTER — Other Ambulatory Visit: Payer: Self-pay | Admitting: Emergency Medicine

## 2014-11-09 MED ORDER — AMLODIPINE BESYLATE 10 MG PO TABS: 10.0000 mg | ORAL_TABLET | Freq: Every day | ORAL | Status: DC

## 2014-11-22 ENCOUNTER — Ambulatory Visit (INDEPENDENT_AMBULATORY_CARE_PROVIDER_SITE_OTHER)
Admission: RE | Admit: 2014-11-22 | Discharge: 2014-11-22 | Disposition: A | Discharge status: Home / Self Care | Payer: Medicare Other | Source: Ambulatory Visit | Attending: Internal Medicine | Admitting: Internal Medicine

## 2014-11-22 ENCOUNTER — Other Ambulatory Visit (INDEPENDENT_AMBULATORY_CARE_PROVIDER_SITE_OTHER): Payer: Medicare Other

## 2014-11-22 ENCOUNTER — Ambulatory Visit (INDEPENDENT_AMBULATORY_CARE_PROVIDER_SITE_OTHER): Payer: Medicare Other | Admitting: Internal Medicine

## 2014-11-22 ENCOUNTER — Other Ambulatory Visit: Payer: Self-pay | Admitting: Emergency Medicine

## 2014-11-22 ENCOUNTER — Encounter: Payer: Self-pay | Admitting: Internal Medicine

## 2014-11-22 VITALS — BP 136/86 | HR 76 | Temp 97.6°F | Resp 18 | Wt 139.0 lb

## 2014-11-22 DIAGNOSIS — E890 Postprocedural hypothyroidism: Secondary | ICD-10-CM

## 2014-11-22 DIAGNOSIS — E1159 Type 2 diabetes mellitus with other circulatory complications: Secondary | ICD-10-CM

## 2014-11-22 DIAGNOSIS — M791 Myalgia: Secondary | ICD-10-CM | POA: Diagnosis not present

## 2014-11-22 DIAGNOSIS — M609 Myositis, unspecified: Secondary | ICD-10-CM

## 2014-11-22 DIAGNOSIS — R918 Other nonspecific abnormal finding of lung field: Secondary | ICD-10-CM | POA: Diagnosis not present

## 2014-11-22 DIAGNOSIS — E785 Hyperlipidemia, unspecified: Secondary | ICD-10-CM | POA: Diagnosis not present

## 2014-11-22 DIAGNOSIS — IMO0001 Reserved for inherently not codable concepts without codable children: Secondary | ICD-10-CM

## 2014-11-22 LAB — LIPID PANEL
CHOL/HDL RATIO: 7
Cholesterol: 257 mg/dL — ABNORMAL HIGH (ref 0–200)
HDL: 38.7 mg/dL — ABNORMAL LOW (ref >39.00)
NONHDL: 218.62
TRIGLYCERIDES: 299 mg/dL — AB (ref 0.0–149.0)
VLDL: 59.8 mg/dL — ABNORMAL HIGH (ref 0.0–40.0)

## 2014-11-22 LAB — BASIC METABOLIC PANEL
BUN: 26 mg/dL — ABNORMAL HIGH (ref 6–23)
CHLORIDE: 99 meq/L (ref 96–112)
CO2: 27 meq/L (ref 19–32)
CREATININE: 1.39 mg/dL — AB (ref 0.40–1.20)
Calcium: 10.2 mg/dL (ref 8.4–10.5)
GFR: 37.38 mL/min — ABNORMAL LOW (ref >60.00)
Glucose, Bld: 276 mg/dL — ABNORMAL HIGH (ref 70–99)
POTASSIUM: 4.1 meq/L (ref 3.5–5.1)
SODIUM: 137 meq/L (ref 135–145)

## 2014-11-22 LAB — HEPATIC FUNCTION PANEL
ALK PHOS: 86 U/L (ref 39–117)
ALT: 19 U/L (ref 0–35)
AST: 20 U/L (ref 0–37)
Albumin: 4.1 g/dL (ref 3.5–5.2)
BILIRUBIN DIRECT: 0.1 mg/dL (ref 0.0–0.3)
BILIRUBIN TOTAL: 0.7 mg/dL (ref 0.2–1.2)
TOTAL PROTEIN: 7.2 g/dL (ref 6.0–8.3)

## 2014-11-22 LAB — LDL CHOLESTEROL, DIRECT: Direct LDL: 154 mg/dL

## 2014-11-22 LAB — CK: CK TOTAL: 71 U/L (ref 7–177)

## 2014-11-22 LAB — MAGNESIUM: MAGNESIUM: 2.1 mg/dL (ref 1.5–2.5)

## 2014-11-22 LAB — TSH: TSH: 1.7 u[IU]/mL (ref 0.35–4.50)

## 2014-11-22 LAB — HEMOGLOBIN A1C: Hgb A1c MFr Bld: 8 % — ABNORMAL HIGH (ref 4.6–6.5)

## 2014-11-22 MED ORDER — LEVOTHYROXINE SODIUM 100 MCG PO TABS: 100.0000 ug | ORAL_TABLET | Freq: Every day | ORAL | Status: DC

## 2014-11-22 NOTE — Assessment & Plan Note (Signed)
TSH 

## 2014-11-22 NOTE — Assessment & Plan Note (Signed)
CXray 

## 2014-11-22 NOTE — Assessment & Plan Note (Signed)
A1c , urine microalbumin, BMET 

## 2014-11-22 NOTE — Patient Instructions (Signed)
  Your next office appointment will be determined based upon review of your pending labs  and  xrays  Those written interpretation of the lab results and instructions will be transmitted to you by My Chart   Critical results will be called.   Followup as needed for any active or acute issue. Please report any significant change in your symptoms. 

## 2014-11-22 NOTE — Progress Notes (Signed)
Pre visit review using our clinic review tool, if applicable. No additional management support is needed unless otherwise documented below in the visit note. 

## 2014-11-22 NOTE — Progress Notes (Signed)
   Subjective:    Patient ID: [name], female    DOB: 03/08/1917, 81 y.o.   MRN: [id number]  HPI The patient is here to assess status of active health conditions.  Her last Synthroid 100 g was taken today. She has been compliant with her medicines without adverse effects. She has remained off amitriptyline as instructed at the last office visit.  She saw the Endocrinologist in September 2015. She was to remain on 35 units of NPH in the morning and 20 before the evening meal. She was to follow-up in December but did not chose to do so.  She's had progressive debilitation and can "hardly walk ". She describes cramps in her legs aggravated by exertion. She is now ambulating with a 4 pronged cane. She does have "staggering" in the morning despite being off amitriptyline. She also has "tingling" in the upper & lower extremities.  She has red meat 3 times a week and fried foods 6 times a week. She is on low-salt diet.   She has a history of pulmonary nodules; the last chest x-ray was in May 2015. She was to have a  Follow up CT scan but this was never completed.  She denies other cardiovascular, respiratory, GU, GI symptoms. She denies excessive hunger or thirst.  PMH, FH, & Social History reviewed & updated.No change in FH as recorded.   Review of Systems  Chest pain, palpitations, tachycardia, exertional dyspnea, paroxysmal nocturnal dyspnea,  or edema are absent. No unexplained weight loss, abdominal pain, significant dyspepsia, dysphagia, melena, rectal bleeding, or persistently small caliber stools. Dysuria, pyuria, hematuria, frequency, nocturia or polyuria are denied. Change in hair, skin, nails denied. No bowel changes of constipation or diarrhea. No intolerance to heat or cold.     Objective:   Physical Exam  Pertinent or positive findings include: Bilateral ptosis is present. She has complete dentures. There is a grade 1.5 systolic murmur with carotid radiation. Pedal pulses  are decreased. Calves are tender to compression. She walks well without instability but does use a 4 prong cane. She has a large keratotic lesion over the right flank.  General appearance :adequately nourished; in no distress.  Eyes: No conjunctival inflammation or scleral icterus is present.  Oral exam:  Lips and gums are healthy appearing.There is no oropharyngeal erythema or exudate noted.   Heart:  Normal rate and regular rhythm. S1 and S2 normal without gallop,click, rub or other extra sounds    Lungs:Chest clear to auscultation; no wheezes, rhonchi,rales ,or rubs present.No increased work of breathing.   Abdomen: bowel sounds normal, soft and non-tender without masses, organomegaly or hernias noted.  No guarding or rebound.   Vascular : all pulses equal ; no bruits present.  Skin:Warm & dry.  Intact without suspicious lesions or rashes ; no tenting or jaundice   Lymphatic: No lymphadenopathy is noted about the head, neck, axilla.   Neuro: Strength, tone decreased.      Assessment & Plan:  See Current Assessment & Plan in Problem List under specific Diagnosis

## 2015-01-15 ENCOUNTER — Other Ambulatory Visit: Payer: Self-pay | Admitting: Geriatric Medicine

## 2015-01-15 ENCOUNTER — Telehealth: Payer: Self-pay | Admitting: Internal Medicine

## 2015-01-15 MED ORDER — FUROSEMIDE 40 MG PO TABS: 40.0000 mg | ORAL_TABLET | Freq: Every day | ORAL | Status: DC

## 2015-01-15 NOTE — Telephone Encounter (Signed)
Sent refill to [facility]

## 2015-01-15 NOTE — Telephone Encounter (Signed)
Pt called state that they request refill for furosemide (LASIX) 40 MG [facility] Dr. Alwyn RenHopper left but never heard anything from our office. Please help, pt has an appt with Dr. Okey Duprerawford on 01/29/15.

## 2015-01-29 ENCOUNTER — Encounter: Payer: Self-pay | Admitting: Internal Medicine

## 2015-01-29 ENCOUNTER — Ambulatory Visit (INDEPENDENT_AMBULATORY_CARE_PROVIDER_SITE_OTHER): Payer: Medicare Other | Admitting: Internal Medicine

## 2015-01-29 VITALS — BP 160/62 | HR 100 | Temp 98.2°F | Resp 20 | Ht 64.0 in | Wt 138.1 lb

## 2015-01-29 DIAGNOSIS — Z23 Encounter for immunization: Secondary | ICD-10-CM | POA: Diagnosis not present

## 2015-01-29 DIAGNOSIS — R413 Other amnesia: Secondary | ICD-10-CM | POA: Insufficient documentation

## 2015-01-29 DIAGNOSIS — I10 Essential (primary) hypertension: Secondary | ICD-10-CM

## 2015-01-29 DIAGNOSIS — Z794 Long term (current) use of insulin: Secondary | ICD-10-CM

## 2015-01-29 DIAGNOSIS — J441 Chronic obstructive pulmonary disease with (acute) exacerbation: Secondary | ICD-10-CM

## 2015-01-29 DIAGNOSIS — N289 Disorder of kidney and ureter, unspecified: Secondary | ICD-10-CM

## 2015-01-29 DIAGNOSIS — J449 Chronic obstructive pulmonary disease, unspecified: Secondary | ICD-10-CM

## 2015-01-29 DIAGNOSIS — E118 Type 2 diabetes mellitus with unspecified complications: Secondary | ICD-10-CM | POA: Diagnosis not present

## 2015-01-29 DIAGNOSIS — E890 Postprocedural hypothyroidism: Secondary | ICD-10-CM

## 2015-01-29 DIAGNOSIS — E1159 Type 2 diabetes mellitus with other circulatory complications: Secondary | ICD-10-CM

## 2015-01-29 MED ORDER — ALBUTEROL SULFATE HFA 108 (90 BASE) MCG/ACT IN AERS: 2.0000 | INHALATION_SPRAY | Freq: Four times a day (QID) | RESPIRATORY_TRACT | Status: DC

## 2015-01-29 MED ORDER — LOSARTAN POTASSIUM 50 MG PO TABS: ORAL_TABLET | ORAL | Status: DC

## 2015-01-29 MED ORDER — LEVOTHYROXINE SODIUM 100 MCG PO TABS: 100.0000 ug | ORAL_TABLET | Freq: Every day | ORAL | Status: DC

## 2015-01-29 MED ORDER — FUROSEMIDE 40 MG PO TABS: 40.0000 mg | ORAL_TABLET | Freq: Every day | ORAL | Status: DC

## 2015-01-29 MED ORDER — AMLODIPINE BESYLATE 10 MG PO TABS: 10.0000 mg | ORAL_TABLET | Freq: Every day | ORAL | Status: DC

## 2015-01-29 NOTE — Patient Instructions (Signed)
We have sent in the refills today of your medicines.   We are not checking blood work today.   We would like to see you back in about 3-6 months for a check up. If you have any problems or questions sooner please feel free to call the office.   We have given you the pneumonia booster shot.   Fall Prevention in the Home  Falls can cause injuries and can affect people from all age groups. There are many simple things that you can do to make your home safe and to help prevent falls. WHAT CAN I DO ON THE OUTSIDE OF MY HOME?  Regularly repair the edges of walkways and driveways and fix any cracks.  Remove high doorway thresholds.  Trim any shrubbery on the main path into your home.  Use bright outdoor lighting.  Clear walkways of debris and clutter, including tools and rocks.  Regularly check that handrails are securely fastened and in good repair. Both sides of any steps should have handrails.  Install guardrails along the edges of any raised decks or porches.  Have leaves, snow, and ice cleared regularly.  Use sand or salt on walkways during winter months.  In the garage, clean up any spills right away, including grease or oil spills. WHAT CAN I DO IN THE BATHROOM?  Use night lights.  Install grab bars by the toilet and in the tub and shower. Do not use towel bars as grab bars.  Use non-skid mats or decals on the floor of the tub or shower.  If you need to sit down while you are in the shower, use a plastic, non-slip stool.Marland Kitchen  Keep the floor dry. Immediately clean up any water that spills on the floor.  Remove soap buildup in the tub or shower on a regular basis.  Attach bath mats securely with double-sided non-slip rug tape.  Remove throw rugs and other tripping hazards from the floor. WHAT CAN I DO IN THE BEDROOM?  Use night lights.  Make sure that a bedside light is easy to reach.  Do not use oversized bedding that drapes onto the floor.  Have a firm chair that  has side arms to use for getting dressed.  Remove throw rugs and other tripping hazards from the floor. WHAT CAN I DO IN THE KITCHEN?   Clean up any spills right away.  Avoid walking on wet floors.  Place frequently used items in easy-to-reach places.  If you need to reach for something above you, use a sturdy step stool that has a grab bar.  Keep electrical cables out of the way.  Do not use floor polish or wax that makes floors slippery. If you have to use wax, make sure that it is non-skid floor wax.  Remove throw rugs and other tripping hazards from the floor. WHAT CAN I DO IN THE STAIRWAYS?  Do not leave any items on the stairs.  Make sure that there are handrails on both sides of the stairs. Fix handrails that are broken or loose. Make sure that handrails are as long as the stairways.  Check any carpeting to make sure that it is firmly attached to the stairs. Fix any carpet that is loose or worn.  Avoid having throw rugs at the top or bottom of stairways, or secure the rugs with carpet tape to prevent them from moving.  Make sure that you have a light switch at the top of the stairs and the bottom of the stairs.  If you do not have them, have them installed. WHAT ARE SOME OTHER FALL PREVENTION TIPS?  Wear closed-toe shoes that fit well and support your feet. Wear shoes that have rubber soles or low heels.  When you use a stepladder, make sure that it is completely opened and that the sides are firmly locked. Have someone hold the ladder while you are using it. Do not climb a closed stepladder.  Add color or contrast paint or tape to grab bars and handrails in your home. Place contrasting color strips on the first and last steps.  Use mobility aids as needed, such as canes, walkers, scooters, and crutches.  Turn on lights if it is dark. Replace any light bulbs that burn out.  Set up furniture so that there are clear paths. Keep the furniture in the same spot.  Fix any  uneven floor surfaces.  Choose a carpet design that does not hide the edge of steps of a stairway.  Be aware of any and all pets.  Review your medicines with your healthcare provider. Some medicines can cause dizziness or changes in blood pressure, which increase your risk of falling. Talk with your health care provider about other ways that you can decrease your risk of falls. This may include working with a physical therapist or trainer to improve your strength, balance, and endurance.   This information is not intended to replace advice given to you by your health care provider. Make sure you discuss any questions you have with your health care provider.   Document Released: 12/19/2001 Document Revised: 05/15/2014 Document Reviewed: 02/02/2014 Elsevier Interactive Patient Education [employer]

## 2015-01-29 NOTE — Assessment & Plan Note (Signed)
Reviewed recent labs and no indication for change today, on 100 mcg synthroid.
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
## 2015-01-29 NOTE — Assessment & Plan Note (Signed)
Reviewed recent labs which are stable.

## 2015-01-29 NOTE — Assessment & Plan Note (Signed)
On humulin 70/30 35 units in the morning and 20 units at night time. Morning sugars 130s which is fine. Last HgA1c 8.0 which is close to goal <8. Given her long history of hypoglycemia episodes would not titrate regimen today. Foot exam done today. Reminded about yearly eye exam but since she no longer drives she feels this is unimportant if she can see fine.

## 2015-01-29 NOTE — Progress Notes (Signed)
   Subjective:    Patient ID: [name], female    DOB: [birth year], 82 y.o.   MRN: [id number]  HPI The patient is a 82 YO female coming in for some old concerns and a new complaint. She is here for her diabetes (controlled, denies low sugars, did not bring sugar log, on humulin 70/30 35 unit qam 20 units qpm, on losartan as well and complicated by neuropathy and worsening over the last year), her blood pressure (slightly high today although well controlled in the past on amlodipine and lasix and losartan) and new memory problems (forgetting tasks, generally able to remember, does not drive anymore and lives with her son, still cooks and no problems with that).   PMH, [facility], social history reviewed and updated.   Review of Systems  Constitutional: Positive for activity change. Negative for fever, appetite change, fatigue and unexpected weight change.  HENT: Negative.   Eyes: Negative.   Respiratory: Positive for shortness of breath. Negative for cough, chest tightness and wheezing.   Cardiovascular: Negative for chest pain, palpitations and leg swelling.  Gastrointestinal: Negative for nausea, vomiting, abdominal pain, diarrhea, constipation and abdominal distention.  Musculoskeletal: Positive for arthralgias and gait problem. Negative for myalgias and back pain.  Skin: Negative.   Neurological: Positive for weakness and numbness. Negative for dizziness, light-headedness and headaches.       Memory change  Psychiatric/Behavioral: Negative.       Objective:   Physical Exam  Constitutional: She is oriented to person, place, and time. She appears well-developed and well-nourished.  frail  HENT:  Head: Normocephalic and atraumatic.  Eyes: EOM are normal.  Neck: Normal range of motion.  Cardiovascular: Normal rate and regular rhythm.   Pulmonary/Chest: Effort normal. No respiratory distress. She has wheezes. She has no rales. She exhibits no tenderness.  Some mild expiratory wheezing  diffuse, no rales or rhochi  Abdominal: Soft. Bowel sounds are normal. She exhibits no distension. There is no tenderness. There is no rebound.  Musculoskeletal: She exhibits no edema.  Neurological: She is alert and oriented to person, place, and time. Coordination abnormal.  Uses 4 prong cane to walk  Skin: Skin is warm and dry.   Filed Vitals:   01/29/15 0855  BP: 170/58  Pulse: 100  Temp: 98.2 F (36.8 C)  TempSrc: Oral  Resp: 20  Height:  (1.626 m)  Weight: 138 lb 1.9 oz (62.651 kg)  SpO2: 96%      Assessment & Plan:  Prevnar 13 given at visit.

## 2015-01-29 NOTE — Assessment & Plan Note (Signed)
No flare today, some mild wheezing on exam. Using albuterol prn for SOB. Does not need daily inhaler at this time. Non-smoker for some time.

## 2015-01-29 NOTE — Addendum Note (Signed)
Addended by: Conception Chancy R on: 01/29/2015 02:19 PM   Modules accepted: Orders

## 2015-01-29 NOTE — Progress Notes (Signed)
Pre visit review using our clinic review tool, if applicable. No additional management support is needed unless otherwise documented below in the visit note. 

## 2015-01-29 NOTE — Assessment & Plan Note (Signed)
BP slightly elevated today which is not usual. Recheck slightly lower but still not at goal. She will work on diet as she is eating more salt lately. Continue losartan, amlodipine, and lasix. Reviewed recent labs and no indication for change.

## 2015-04-02 ENCOUNTER — Other Ambulatory Visit: Payer: Self-pay

## 2015-04-02 MED ORDER — GLUCOSE BLOOD VI STRP: ORAL_STRIP | Status: DC

## 2015-05-13 ENCOUNTER — Other Ambulatory Visit: Payer: Self-pay

## 2015-05-13 MED ORDER — INSULIN NPH ISOPHANE & REGULAR (70-30) 100 UNIT/ML ~~LOC~~ SUSP: SUBCUTANEOUS | Status: DC

## 2015-06-13 ENCOUNTER — Ambulatory Visit (INDEPENDENT_AMBULATORY_CARE_PROVIDER_SITE_OTHER): Payer: Medicare Other | Admitting: Internal Medicine

## 2015-06-13 ENCOUNTER — Other Ambulatory Visit (INDEPENDENT_AMBULATORY_CARE_PROVIDER_SITE_OTHER): Payer: Medicare Other

## 2015-06-13 ENCOUNTER — Encounter: Payer: Self-pay | Admitting: Internal Medicine

## 2015-06-13 VITALS — BP 150/68 | HR 90 | Temp 97.9°F | Resp 18 | Ht 63.0 in | Wt 138.1 lb

## 2015-06-13 DIAGNOSIS — Z794 Long term (current) use of insulin: Secondary | ICD-10-CM | POA: Diagnosis not present

## 2015-06-13 DIAGNOSIS — J441 Chronic obstructive pulmonary disease with (acute) exacerbation: Secondary | ICD-10-CM

## 2015-06-13 DIAGNOSIS — E039 Hypothyroidism, unspecified: Secondary | ICD-10-CM | POA: Diagnosis not present

## 2015-06-13 DIAGNOSIS — E1159 Type 2 diabetes mellitus with other circulatory complications: Secondary | ICD-10-CM

## 2015-06-13 DIAGNOSIS — E119 Type 2 diabetes mellitus without complications: Secondary | ICD-10-CM

## 2015-06-13 DIAGNOSIS — E890 Postprocedural hypothyroidism: Secondary | ICD-10-CM

## 2015-06-13 DIAGNOSIS — J449 Chronic obstructive pulmonary disease, unspecified: Secondary | ICD-10-CM

## 2015-06-13 DIAGNOSIS — M25532 Pain in left wrist: Secondary | ICD-10-CM

## 2015-06-13 LAB — COMPREHENSIVE METABOLIC PANEL
ALT: 17 U/L (ref 0–35)
AST: 19 U/L (ref 0–37)
Albumin: 4.3 g/dL (ref 3.5–5.2)
Alkaline Phosphatase: 85 U/L (ref 39–117)
BILIRUBIN TOTAL: 0.5 mg/dL (ref 0.2–1.2)
BUN: 30 mg/dL — ABNORMAL HIGH (ref 6–23)
CALCIUM: 11 mg/dL — AB (ref 8.4–10.5)
CO2: 28 meq/L (ref 19–32)
CREATININE: 1.42 mg/dL — AB (ref 0.40–1.20)
Chloride: 101 mEq/L (ref 96–112)
GFR: 36.43 mL/min — ABNORMAL LOW (ref >60.00)
GLUCOSE: 175 mg/dL — AB (ref 70–99)
Potassium: 4.3 mEq/L (ref 3.5–5.1)
Sodium: 139 mEq/L (ref 135–145)
Total Protein: 7.6 g/dL (ref 6.0–8.3)

## 2015-06-13 LAB — CBC
HCT: 46.3 % — ABNORMAL HIGH (ref 36.0–46.0)
HEMOGLOBIN: 15.4 g/dL — AB (ref 12.0–15.0)
MCHC: 33.2 g/dL (ref 30.0–36.0)
MCV: 81.8 fl (ref 78.0–100.0)
PLATELETS: 229 10*3/uL (ref 150.0–400.0)
RBC: 5.66 Mil/uL — AB (ref 3.87–5.11)
RDW: 14.7 % (ref 11.5–15.5)
WBC: 11.2 10*3/uL — ABNORMAL HIGH (ref 4.0–10.5)

## 2015-06-13 LAB — HEMOGLOBIN A1C: Hgb A1c MFr Bld: 7.9 % — ABNORMAL HIGH (ref 4.6–6.5)

## 2015-06-13 LAB — T4, FREE: Free T4: 2.7 ng/dL — ABNORMAL HIGH (ref 0.60–1.60)

## 2015-06-13 LAB — TSH: TSH: 2.32 u[IU]/mL (ref 0.35–4.50)

## 2015-06-13 MED ORDER — ALBUTEROL SULFATE HFA 108 (90 BASE) MCG/ACT IN AERS: 2.0000 | INHALATION_SPRAY | Freq: Four times a day (QID) | RESPIRATORY_TRACT | Status: DC

## 2015-06-13 MED ORDER — TRAZODONE HCL 50 MG PO TABS: 25.0000 mg | ORAL_TABLET | Freq: Every evening | ORAL | Status: DC | PRN

## 2015-06-13 NOTE — Progress Notes (Signed)
   Subjective:    Patient ID: Linda LeylandElsie Patton, female    DOB: 08/28/1917, 82 y.o.   MRN: [id number]  HPI The patient is a 82 YO female coming in for knot on her left wrist. She is not sure how long it has been there. Probably about 3 months. It is not painful unless she bangs it on something. No injury to the area. No pain on ROM of the wrist or hand. No drainage from the area or rash on the skin.  Also coming in for cough which is new. She is having some sinus drainage. This is causing her to be out of breath more often. She is out of her inhaler although has used it in the past successfully. Denies fevers or chills. Cough is non-productive.  She also needs follow up on her other medical problems and see A/P for status and treatment of those.   Review of Systems  Constitutional: Positive for activity change. Negative for fever, appetite change, fatigue and unexpected weight change.  HENT: Positive for congestion and rhinorrhea. Negative for ear discharge, ear pain, sinus pressure, sore throat and trouble swallowing.   Eyes: Negative.   Respiratory: Positive for cough and shortness of breath. Negative for chest tightness and wheezing.   Cardiovascular: Negative for chest pain, palpitations and leg swelling.  Gastrointestinal: Negative for nausea, vomiting, abdominal pain, diarrhea, constipation and abdominal distention.  Musculoskeletal: Positive for arthralgias and gait problem. Negative for myalgias and back pain.  Skin: Negative.   Neurological: Positive for numbness. Negative for dizziness, light-headedness and headaches.       Memory change  Psychiatric/Behavioral: Negative.       Objective:   Physical Exam  Constitutional: She is oriented to person, place, and time. She appears well-developed and well-nourished.  frail  HENT:  Head: Normocephalic and atraumatic.  Eyes: EOM are normal.  Neck: Normal range of motion.  Cardiovascular: Normal rate and regular rhythm.   Pulmonary/Chest:  Effort normal. No respiratory distress. She has wheezes. She has no rales. She exhibits no tenderness.  Some mild expiratory wheezing diffuse, lung exam mildly improved or stable from last visit.   Abdominal: Soft. Bowel sounds are normal. She exhibits no distension. There is no tenderness. There is no rebound.  Musculoskeletal: She exhibits no edema.  Neurological: She is alert and oriented to person, place, and time. Coordination abnormal.  Uses 4 prong cane to walk  Skin: Skin is warm and dry.  Lesion on the left wrist which feels bony and is freely mobile, no skin color change or rash.    Filed Vitals:   06/13/15 0844  BP: 180/70  Pulse: 90  Temp: 97.9 F (36.6 C)  TempSrc: Oral  Resp: 18  Height: 5\' 3"  (1.6 m)  Weight: 138 lb 1.9 oz (62.651 kg)  SpO2: 95%      Assessment & Plan:

## 2015-06-13 NOTE — Assessment & Plan Note (Signed)
Checking HgA1c and taking nph/70/30 still 35 and 20 units daily. She denies low sugars but is having some high sugars at times. Last HgA1c 8.0 and okay with that as she has long history of hypoglycemia in the past. Taking ARB. Adjust as needed.

## 2015-06-13 NOTE — Patient Instructions (Signed)
We have refilled the inhaler which you can use up to 4 times a day for the breathing.   Likely some of the breathing is coming from allergies and sinus drainage. It is okay to take claritin if needed for drainage.   We have sent in trazodone for the sleeping which you can take. It can take 1 week to get into your blood stream fully so give it a week before you decide it does not work.   We are checking the blood work today and will call back with the results.   We will watch that spot on your hand and if it changes we can check it out.

## 2015-06-13 NOTE — Assessment & Plan Note (Signed)
There is a bony prominence on the left wrist which is mostly non-tender. Will observe and if growing will x-ray and refer for removal. No discharge or skin change on exam today.

## 2015-06-13 NOTE — Progress Notes (Signed)
Pre visit review using our clinic review tool, if applicable. No additional management support is needed unless otherwise documented below in the visit note. 

## 2015-06-13 NOTE — Assessment & Plan Note (Signed)
Checking TSH and free T4 and adjust as needed. Taking synthroid 100 mcg daily now. No symptoms of over or under replacement.

## 2015-06-13 NOTE — Assessment & Plan Note (Signed)
Refill albuterol inhaler today and would not consider flare. Lung exam normal. Suspect allergies causing more coughing and advised her to start taking zyrtec.

## 2015-06-17 ENCOUNTER — Other Ambulatory Visit: Payer: Self-pay | Admitting: Internal Medicine

## 2015-06-17 MED ORDER — LEVOTHYROXINE SODIUM 50 MCG PO TABS: 50.0000 ug | ORAL_TABLET | Freq: Every day | ORAL | Status: DC

## 2015-06-28 ENCOUNTER — Emergency Department (HOSPITAL_COMMUNITY): Payer: Medicare Other

## 2015-06-28 ENCOUNTER — Emergency Department (HOSPITAL_COMMUNITY)
Admission: EM | Admit: 2015-06-28 | Discharge: 2015-06-28 | Disposition: A | Discharge status: Home / Self Care | Payer: Medicare Other | Attending: Emergency Medicine | Admitting: Emergency Medicine

## 2015-06-28 ENCOUNTER — Ambulatory Visit (INDEPENDENT_AMBULATORY_CARE_PROVIDER_SITE_OTHER): Payer: Medicare Other | Admitting: Family

## 2015-06-28 ENCOUNTER — Encounter: Payer: Self-pay | Admitting: Family

## 2015-06-28 ENCOUNTER — Encounter (HOSPITAL_COMMUNITY): Payer: Self-pay | Admitting: Emergency Medicine

## 2015-06-28 VITALS — BP 180/72 | HR 84 | Resp 28 | Ht 64.0 in | Wt 136.0 lb

## 2015-06-28 DIAGNOSIS — Z7982 Long term (current) use of aspirin: Secondary | ICD-10-CM | POA: Insufficient documentation

## 2015-06-28 DIAGNOSIS — J441 Chronic obstructive pulmonary disease with (acute) exacerbation: Secondary | ICD-10-CM | POA: Insufficient documentation

## 2015-06-28 DIAGNOSIS — Z7951 Long term (current) use of inhaled steroids: Secondary | ICD-10-CM | POA: Insufficient documentation

## 2015-06-28 DIAGNOSIS — E039 Hypothyroidism, unspecified: Secondary | ICD-10-CM | POA: Insufficient documentation

## 2015-06-28 DIAGNOSIS — E119 Type 2 diabetes mellitus without complications: Secondary | ICD-10-CM | POA: Insufficient documentation

## 2015-06-28 DIAGNOSIS — I10 Essential (primary) hypertension: Secondary | ICD-10-CM | POA: Diagnosis not present

## 2015-06-28 DIAGNOSIS — E785 Hyperlipidemia, unspecified: Secondary | ICD-10-CM | POA: Insufficient documentation

## 2015-06-28 DIAGNOSIS — Z794 Long term (current) use of insulin: Secondary | ICD-10-CM | POA: Diagnosis not present

## 2015-06-28 DIAGNOSIS — J449 Chronic obstructive pulmonary disease, unspecified: Secondary | ICD-10-CM

## 2015-06-28 DIAGNOSIS — R0602 Shortness of breath: Secondary | ICD-10-CM | POA: Diagnosis present

## 2015-06-28 LAB — BASIC METABOLIC PANEL
ANION GAP: 9 (ref 5–15)
BUN: 26 mg/dL — ABNORMAL HIGH (ref 6–20)
CALCIUM: 9.8 mg/dL (ref 8.9–10.3)
CO2: 25 mmol/L (ref 22–32)
Chloride: 102 mmol/L (ref 101–111)
Creatinine, Ser: 1.36 mg/dL — ABNORMAL HIGH (ref 0.44–1.00)
GFR, EST AFRICAN AMERICAN: 37 mL/min — AB (ref >60)
GFR, EST NON AFRICAN AMERICAN: 32 mL/min — AB (ref >60)
Glucose, Bld: 250 mg/dL — ABNORMAL HIGH (ref 65–99)
Potassium: 3.5 mmol/L (ref 3.5–5.1)
Sodium: 136 mmol/L (ref 135–145)

## 2015-06-28 LAB — CBC
HCT: 43.2 % (ref 36.0–46.0)
HEMOGLOBIN: 14.9 g/dL (ref 12.0–15.0)
MCH: 27.8 pg (ref 26.0–34.0)
MCHC: 34.5 g/dL (ref 30.0–36.0)
MCV: 80.6 fL (ref 78.0–100.0)
Platelets: 220 10*3/uL (ref 150–400)
RBC: 5.36 MIL/uL — AB (ref 3.87–5.11)
RDW: 14.2 % (ref 11.5–15.5)
WBC: 10.8 10*3/uL — AB (ref 4.0–10.5)

## 2015-06-28 MED ORDER — IPRATROPIUM BROMIDE 0.02 % IN SOLN
0.5000 mg | Freq: Once | RESPIRATORY_TRACT | Status: AC
  Administered 2015-06-28: 0.5 mg via RESPIRATORY_TRACT

## 2015-06-28 MED ORDER — ALBUTEROL SULFATE (2.5 MG/3ML) 0.083% IN NEBU
5.0000 mg | INHALATION_SOLUTION | Freq: Once | RESPIRATORY_TRACT | Status: AC
  Administered 2015-06-28: 5 mg via RESPIRATORY_TRACT
  Filled 2015-06-28: qty 6

## 2015-06-28 NOTE — Assessment & Plan Note (Addendum)
Symptoms and exam consistent with COPD exacerbation refractory to DuoNeb treatment. Patient continued to wheeze with accessory muscle usage and unable to to speak in complete sentences secondary to work of breathing. Recommend evaluation in the emergency department as patient may require IV medications and additional breathing treatments. Patient did not wish to go via ambulance willing to go in personal vehicle. Appears stable with oxygen level of 94%. Patient is in agreement with the plan and emergency department notified.

## 2015-06-28 NOTE — Discharge Instructions (Signed)
Use albuterol inhaler 2 puffs every 6 hours for the next 7 days. Return for any new or worse breathing problems. Recommend follow-up with primary care doctor regarding blood pressure trending high and the concerns for the change in the thyroid medicine. Today chest x-ray was negative.

## 2015-06-28 NOTE — ED Notes (Signed)
PT DISCHARGED. INSTRUCTIONS GIVEN. AAOX4. PT IN NO APPARENT DISTRESS OR PAIN. THE OPPORTUNITY TO ASK QUESTIONS WAS PROVIDED. 

## 2015-06-28 NOTE — ED Provider Notes (Signed)
CSN: [id number]     Arrival date & time 06/28/15  1431 History   First MD Initiated Contact with Patient 06/28/15 1455     Chief Complaint  Patient presents with  . Shortness of Breath     (Consider location/radiation/quality/duration/timing/severity/associated sxs/prior Treatment) Patient is a 82 y.o. female presenting with shortness of breath. The history is provided by the patient.  Shortness of Breath Associated symptoms: no abdominal pain, no chest pain, no fever, no headaches and no rash   Patient with known history of COPD. Patient has albuterol inhaler at home but has not been using it on a regular basis. Patient was a primary care office today was noted to have significant wheezing. Noted to have elevated blood pressure. Patient given breathing treatment there. Patient given breathing treatment upon arrival here. All wheezing has resolved. In addition patient had her thyroid medicine recently decreased because it was too high. Patient denies any chest pain or any fevers.  Past Medical History  Diagnosis Date  . CATARACT EXTRACTION, HX OF 09/21/2006  . COLONIC POLYPS, HX OF 09/21/2006  . COPD 09/21/2006  . DIABETES MELLITUS 09/21/2006  . DVT 09/21/2006  . Headache(784.0) 06/13/2007  . HYPERLIPIDEMIA 09/21/2006  . HYPERTENSION 09/21/2006  . HYPOTHYROIDISM 09/21/2006  . HYSTERECTOMY, HX OF 09/21/2006  . NEURALGIA, TRIGEMINAL 10/01/2008  . OSTEOPOROSIS 09/21/2006  . PLANTAR FASCIITIS 09/21/2006  . PRIMARY LOCALIZED OSTEOARTHROSIS HAND 06/13/2007  . THYROIDECTOMY, HX OF 09/21/2006   Past Surgical History  Procedure Laterality Date  . Abdominal hysterectomy    . Cataract extraction    . Thyroidectomy      ? partial for goiter   Family History  Problem Relation Age of Onset  . Heart disease Mother     dropsy  . Stroke Mother     < 54  . Lung cancer Son     smoker   Social History  Substance Use Topics  . Smoking status: Former Smoker    Quit date: 01/14/1979  . Smokeless tobacco: Never Used      Comment: smoked 10 years, < 1 ppd  . Alcohol Use: No   OB History    No data available     Review of Systems  Constitutional: Negative for fever.  HENT: Negative for congestion.   Eyes: Negative for visual disturbance.  Respiratory: Positive for shortness of breath.   Cardiovascular: Negative for chest pain.  Gastrointestinal: Negative for abdominal pain.  Genitourinary: Negative for dysuria.  Skin: Negative for rash.  Neurological: Negative for headaches.  Hematological: Does not bruise/bleed easily.  Psychiatric/Behavioral: Negative for confusion.      Allergies  Codeine  Home Medications   Prior to Admission medications   Medication Sig Start Date End Date Taking? Authorizing Provider  albuterol (PROVENTIL HFA;VENTOLIN HFA) 108 (90 Base) MCG/ACT inhaler Inhale 2 puffs into the lungs 4 (four) times daily. 06/13/15  Yes Myrlene Broker, MD  amLODipine (NORVASC) 10 MG tablet Take 1 tablet (10 mg total) by mouth daily. 01/29/15  Yes Myrlene Broker, MD  aspirin 81 MG tablet Take 81 mg by mouth 3 (three) times daily.    Yes Historical Provider, MD  Biotin 5000 MCG CAPS Take by mouth.   Yes Historical Provider, MD  calcium-vitamin D (OSCAL WITH D) 500-200 MG-UNIT per tablet Take 1 tablet by mouth daily.     Yes Historical Provider, MD  furosemide (LASIX) 40 MG tablet Take 1 tablet (40 mg total) by mouth daily. 01/29/15  Yes Austin Miles  Crawford, MD  glucose blood (ONE TOUCH ULTRA TEST) test strip USE AS DIRECTED TO TEST BLOOD SUGAR TWICE DAILY ICD10 E11 51 04/02/15  Yes Myrlene Broker, MD  insulin NPH-regular Human (HUMULIN 70/30) (70-30) 100 UNIT/ML injection inject 35 units every morning and 20 units IN THE EVENING 05/13/15  Yes Myrlene Broker, MD  Insulin Syringe-Needle U-100 (B-D INS SYRINGE 0.5CC/31GX5/16) 31G X 5/16" 0.5 ML MISC Use to administer insulin twice a day Dx 250.00 --- Need to establish with new PCP for additional refills 10/17/14  Yes Pecola Lawless, MD  levothyroxine (SYNTHROID, LEVOTHROID) 50 MCG tablet Take 1 tablet (50 mcg total) by mouth daily. 06/17/15  Yes Myrlene Broker, MD  losartan (COZAAR) 50 MG tablet 1&1/2 qd 01/29/15  Yes Myrlene Broker, MD  Multiple Vitamins-Minerals (MULTIVITAMIN WITH MINERALS) tablet Take 1 tablet by mouth daily.     Yes Historical Provider, MD  NOVOFINE 32G X 6 MM MISC  06/09/13  Yes Historical Provider, MD  psyllium (METAMUCIL) 58.6 % powder Take 1 packet by mouth every evening.    Yes Historical Provider, MD  traZODone (DESYREL) 50 MG tablet Take 0.5-1 tablets (25-50 mg total) by mouth at bedtime as needed for sleep. 06/13/15  Yes Myrlene Broker, MD  vitamin C (ASCORBIC ACID) 500 MG tablet Take 500 mg by mouth daily.   Yes Historical Provider, MD  vitamin E 400 UNIT capsule Take 400 Units by mouth daily.     Yes Historical Provider, MD   BP 152/60 mmHg  Pulse 85  Temp(Src) 97.3 F (36.3 C)  Resp 20  Ht 5\' 3"  (1.6 m)  Wt 62.596 kg  BMI 24.45 kg/m2  SpO2 95% Physical Exam  Constitutional: She is oriented to person, place, and time. She appears well-developed and well-nourished. No distress.  HENT:  Head: Normocephalic and atraumatic.  Mouth/Throat: Oropharynx is clear and moist.  Eyes: Conjunctivae and EOM are normal. Pupils are equal, round, and reactive to light.  Neck: Normal range of motion. Neck supple.  Cardiovascular: Normal rate, regular rhythm and normal heart sounds.   No murmur heard. Pulmonary/Chest: Effort normal and breath sounds normal. No respiratory distress.  Abdominal: Soft. Bowel sounds are normal. There is no tenderness.  Musculoskeletal: Normal range of motion.  Neurological: She is alert and oriented to person, place, and time. No cranial nerve deficit. She exhibits normal muscle tone. Coordination normal.  Skin: Skin is warm. No rash noted.  Nursing note and vitals reviewed.   ED Course  Procedures (including critical care time) Labs Review Labs  Reviewed  BASIC METABOLIC PANEL - Abnormal; Notable for the following:    Glucose, Bld 250 (*)    BUN 26 (*)    Creatinine, Ser 1.36 (*)    GFR calc non Af Amer 32 (*)    GFR calc Af Amer 37 (*)    All other components within normal limits  CBC - Abnormal; Notable for the following:    WBC 10.8 (*)    RBC 5.36 (*)    All other components within normal limits   Results for orders placed or performed during the hospital encounter of 06/28/15  Basic metabolic panel  Result Value Ref Range   Sodium 136 135 - 145 mmol/L   Potassium 3.5 3.5 - 5.1 mmol/L   Chloride 102 101 - 111 mmol/L   CO2 25 22 - 32 mmol/L   Glucose, Bld 250 (H) 65 - 99 mg/dL   BUN 26 (H)  6 - 20 mg/dL   Creatinine, Ser 1.611.36 (H) 0.44 - 1.00 mg/dL   Calcium 9.8 8.9 - 09.610.3 mg/dL   GFR calc non Af Amer 32 (L) >60 mL/min   GFR calc Af Amer 37 (L) >60 mL/min   Anion gap 9 5 - 15  CBC  Result Value Ref Range   WBC 10.8 (H) 4.0 - 10.5 K/uL   RBC 5.36 (H) 3.87 - 5.11 MIL/uL   Hemoglobin 14.9 12.0 - 15.0 g/dL   HCT 04.543.2 40.936.0 - 81.146.0 %   MCV 80.6 78.0 - 100.0 fL   MCH 27.8 26.0 - 34.0 pg   MCHC 34.5 30.0 - 36.0 g/dL   RDW 91.414.2 78.211.5 - 95.615.5 %   Platelets 220 150 - 400 K/uL    Imaging Review Dg Chest 2 View  06/28/2015  CLINICAL DATA:  Shortness of breath. EXAM: CHEST  2 VIEW COMPARISON:  Radiograph of November 22, 2014. FINDINGS: The heart size and mediastinal contours are within normal limits. No pneumothorax or pleural effusion is noted. Atherosclerosis of thoracic aorta is noted. Left lung is clear. Stable right basilar scarring is noted. No acute pulmonary disease is noted. Multilevel degenerative disc disease is noted in the mid thoracic spine. IMPRESSION: Stable right basilar scarring. No acute cardiopulmonary abnormality seen. Electronically Signed   By: Lupita RaiderJames  Green Jr, M.D.   On: 06/28/2015 15:10   I have personally reviewed and evaluated these images and lab results as part of my medical decision-making.   EKG  Interpretation   Date/Time:  Friday June 28 2015 14:49:03 EDT Ventricular Rate:  90 PR Interval:  165 QRS Duration: 106 QT Interval:  363 QTC Calculation: 444 R Axis:   -125 Text Interpretation:  Sinus rhythm Atrial premature complex Right  ventricular hypertrophy ST-t wave abnormality Artifact No significant  change since last tracing Abnormal ekg Confirmed by Gerhard MunchLOCKWOOD, ROBERT  MD  [phone]([id number]) on 06/28/2015 2:52:46 PM Also confirmed by Gerhard MunchLOCKWOOD, ROBERT  MD  ([id number]), editor [place], Jola BabinskiMarilyn [phone])  on 06/28/2015 4:04:36 PM      MDM   Final diagnoses:  COPD exacerbation [facility](HCC)    Patient with known COPD. Been having shortness of breath since Monday. Was at primary care doctor office today was noted to have wheezing. Got breathing treatment there. Patient given another breathing treatment here. Wheezing is now resolved. Patient feels fine. Patient has albuterol inhaler at home however she is using it irregularly had not used it at all today. Patient currently not on steroids.  Patient with recent change in her thyroid medicine where the dose was decreased. Patient's also been having some difficulty with elevated blood pressures. Patient without any complaints of any neuro focal deficits headaches chest pain. Follow-up with primary care doctor for of blood pressure follow-up and reconsideration of changes in thyroid medicine.  Patient here chest x-ray negative for pneumonia. Labs without significant abnormalities.  Patient's oxygen saturation on the room air is 93%. Patient does not use oxygen at home.    Vanetta MuldersScott Rahaf Carbonell, MD 06/28/15 1800

## 2015-06-28 NOTE — Patient Instructions (Addendum)
Thank you for choosing [facility].  Summary/Instructions:  Please go to the Emergency Room as your symptoms require additional care.  Chronic Obstructive Pulmonary Disease Chronic obstructive pulmonary disease (COPD) is a common lung condition in which airflow from the lungs is limited. COPD is a general term that can be used to describe many different lung problems that limit airflow, including both chronic bronchitis and emphysema. If you have COPD, your lung function will probably never return to normal, but there are measures you can take to improve lung function and make yourself feel better. CAUSES   Smoking (common).  Exposure to secondhand smoke.  Genetic problems.  Chronic inflammatory lung diseases or recurrent infections. SYMPTOMS  Shortness of breath, especially with physical activity.  Deep, persistent (chronic) cough with a large amount of thick mucus.  Wheezing.  Rapid breaths (tachypnea).  Gray or bluish discoloration (cyanosis) of the skin, especially in your fingers, toes, or lips.  Fatigue.  Weight loss.  Frequent infections or episodes when breathing symptoms become much worse (exacerbations).  Chest tightness. DIAGNOSIS Your health care provider will take a medical history and perform a physical examination to diagnose COPD. Additional tests for COPD may include:  Lung (pulmonary) function tests.  Chest X-ray.  CT scan.  Blood tests. TREATMENT  Treatment for COPD may include:  Inhaler and nebulizer medicines. These help manage the symptoms of COPD and make your breathing more comfortable.  Supplemental oxygen. Supplemental oxygen is only helpful if you have a low oxygen level in your blood.  Exercise and physical activity. These are beneficial for nearly all people with COPD.  Lung surgery or transplant.  Nutrition therapy to gain weight, if you are underweight.  Pulmonary rehabilitation. This may involve working with a team of  health care providers and specialists, such as respiratory, occupational, and physical therapists. HOME CARE INSTRUCTIONS  Take all medicines (inhaled or pills) as directed by your health care provider.  Avoid over-the-counter medicines or cough syrups that dry up your airway (such as antihistamines) and slow down the elimination of secretions unless instructed otherwise by your health care provider.  If you are a smoker, the most important thing that you can do is stop smoking. Continuing to smoke will cause further lung damage and breathing trouble. Ask your health care provider for help with quitting smoking. He or she can direct you to community resources or hospitals that provide support.  Avoid exposure to irritants such as smoke, chemicals, and fumes that aggravate your breathing.  Use oxygen therapy and pulmonary rehabilitation if directed by your health care provider. If you require home oxygen therapy, ask your health care provider whether you should purchase a pulse oximeter to measure your oxygen level at home.  Avoid contact with individuals who have a contagious illness.  Avoid extreme temperature and humidity changes.  Eat healthy foods. Eating smaller, more frequent meals and resting before meals may help you maintain your strength.  Stay active, but balance activity with periods of rest. Exercise and physical activity will help you maintain your ability to do things you want to do.  Preventing infection and hospitalization is very important when you have COPD. Make sure to receive all the vaccines your health care provider recommends, especially the pneumococcal and influenza vaccines. Ask your health care provider whether you need a pneumonia vaccine.  Learn and use relaxation techniques to manage stress.  Learn and use controlled breathing techniques as directed by your health care provider. Controlled breathing techniques include:  Pursed lip breathing. Start by  breathing in (inhaling) through your nose for 1 second. Then, purse your lips as if you were going to whistle and breathe out (exhale) through the pursed lips for 2 seconds.  Diaphragmatic breathing. Start by putting one hand on your abdomen just above your waist. Inhale slowly through your nose. The hand on your abdomen should move out. Then purse your lips and exhale slowly. You should be able to feel the hand on your abdomen moving in as you exhale.  Learn and use controlled coughing to clear mucus from your lungs. Controlled coughing is a series of short, progressive coughs. The steps of controlled coughing are: 1. Lean your head slightly forward. 2. Breathe in deeply using diaphragmatic breathing. 3. Try to hold your breath for 3 seconds. 4. Keep your mouth slightly open while coughing twice. 5. Spit any mucus out into a tissue. 6. Rest and repeat the steps once or twice as needed. SEEK MEDICAL CARE IF:  You are coughing up more mucus than usual.  There is a change in the color or thickness of your mucus.  Your breathing is more labored than usual.  Your breathing is faster than usual. SEEK IMMEDIATE MEDICAL CARE IF:  You have shortness of breath while you are resting.  You have shortness of breath that prevents you from:  Being able to talk.  Performing your usual physical activities.  You have chest pain lasting longer than 5 minutes.  Your skin color is more cyanotic than usual.  You measure low oxygen saturations for longer than 5 minutes with a pulse oximeter. MAKE SURE YOU:  Understand these instructions.  Will watch your condition.  Will get help right away if you are not doing well or get worse.   This information is not intended to replace advice given to you by your health care provider. Make sure you discuss any questions you have with your health care provider.   Document Released: 10/08/2004 Document Revised: 01/19/2014 Document Reviewed:  08/25/2012 Elsevier Interactive Patient Education [employer]

## 2015-06-28 NOTE — ED Notes (Signed)
PT STATES SHE FEELS BETTER AFTER THE BREATHING TX.

## 2015-06-28 NOTE — Progress Notes (Signed)
Subjective:    Patient ID: [name], female    DOB: 04/23/1917, 82 y.o.   MRN: [id number]  Chief Complaint  Patient presents with  . Shortness of Breath    SOB and high BP, weak and wheezing, x1 week    HPI:  [name] is a 82 y.o. female who  has a past medical history of CATARACT EXTRACTION, HX OF (09/21/2006); COLONIC POLYPS, HX OF (09/21/2006); COPD (09/21/2006); DIABETES MELLITUS (09/21/2006); DVT (09/21/2006); WUJWJXBJ(478.2Headache(784.0) (06/13/2007); HYPERLIPIDEMIA (09/21/2006); HYPERTENSION (09/21/2006); HYPOTHYROIDISM (09/21/2006); HYSTERECTOMY, HX OF (09/21/2006); NEURALGIA, TRIGEMINAL (10/01/2008); OSTEOPOROSIS (09/21/2006); PLANTAR FASCIITIS (09/21/2006); PRIMARY LOCALIZED OSTEOARTHROSIS HAND (06/13/2007); and THYROIDECTOMY, HX OF (09/21/2006). and presents today for an acute office visit.   Associated symptoms of shortness of breath and elevated blood pressure have been going on for about 1 week. Blood pressure is currently maintained on Amlodipine, furosemide, and losartan. She reports taking the medication as prescribed and denies adverse side effects or hypotensive readings. Denies symptoms of end organ damage. COPD currently managed on albuterol as needed. Reports taking the medication as prescribed and denies adverse side effects. Noted to have increased need for albuterol over the past week.Course of the symptoms is progressively worsening.   Allergies  Allergen Reactions  . Codeine Nausea And Vomiting     Current Outpatient Prescriptions on File Prior to Visit  Medication Sig Dispense Refill  . albuterol (PROVENTIL HFA;VENTOLIN HFA) 108 (90 Base) MCG/ACT inhaler Inhale 2 puffs into the lungs 4 (four) times daily. 1 Inhaler 3  . amLODipine (NORVASC) 10 MG tablet Take 1 tablet (10 mg total) by mouth daily. 90 tablet 3  . aspirin 81 MG tablet Take 81 mg by mouth 3 (three) times daily.     . Biotin 5000 MCG CAPS Take by mouth.    . calcium-vitamin D (OSCAL WITH D) 500-200 MG-UNIT per tablet Take 1  tablet by mouth daily.      . furosemide (LASIX) 40 MG tablet Take 1 tablet (40 mg total) by mouth daily. 90 tablet 3  . glucose blood (ONE TOUCH ULTRA TEST) test strip USE AS DIRECTED TO TEST BLOOD SUGAR TWICE DAILY ICD10 E11 51 100 each 11  . insulin NPH-regular Human (HUMULIN 70/30) (70-30) 100 UNIT/ML injection inject 35 units every morning and 20 units IN THE EVENING 18 mL 11  . Insulin Syringe-Needle U-100 (B-D INS SYRINGE 0.5CC/31GX5/16) 31G X 5/16" 0.5 ML MISC Use to administer insulin twice a day Dx 250.00 --- Need to establish with new PCP for additional refills 100 each 2  . levothyroxine (SYNTHROID, LEVOTHROID) 50 MCG tablet Take 1 tablet (50 mcg total) by mouth daily. 90 tablet 3  . losartan (COZAAR) 50 MG tablet 1&1/2 qd 135 tablet 3  . Multiple Vitamins-Minerals (MULTIVITAMIN WITH MINERALS) tablet Take 1 tablet by mouth daily.      Marland Kitchen. NOVOFINE 32G X 6 MM MISC     . psyllium (METAMUCIL) 58.6 % powder Take 1 packet by mouth every evening.     . traZODone (DESYREL) 50 MG tablet Take 0.5-1 tablets (25-50 mg total) by mouth at bedtime as needed for sleep. 30 tablet 3  . vitamin C (ASCORBIC ACID) 500 MG tablet Take 500 mg by mouth daily.    . vitamin E 400 UNIT capsule Take 400 Units by mouth daily.       No current facility-administered medications on file prior to visit.     Past Surgical History  Procedure Laterality Date  . Abdominal hysterectomy    .  Cataract extraction    . Thyroidectomy      ? partial for goiter    Past Medical History  Diagnosis Date  . CATARACT EXTRACTION, HX OF 09/21/2006  . COLONIC POLYPS, HX OF 09/21/2006  . COPD 09/21/2006  . DIABETES MELLITUS 09/21/2006  . DVT 09/21/2006  . Headache(784.0) 06/13/2007  . HYPERLIPIDEMIA 09/21/2006  . HYPERTENSION 09/21/2006  . HYPOTHYROIDISM 09/21/2006  . HYSTERECTOMY, HX OF 09/21/2006  . NEURALGIA, TRIGEMINAL 10/01/2008  . OSTEOPOROSIS 09/21/2006  . PLANTAR FASCIITIS 09/21/2006  . PRIMARY LOCALIZED OSTEOARTHROSIS HAND 06/13/2007    . THYROIDECTOMY, HX OF 09/21/2006     Review of Systems  Constitutional: Negative for fever and chills.  Respiratory: Positive for cough, shortness of breath and wheezing.   Cardiovascular: Negative for chest pain, palpitations and leg swelling.  Neurological: Negative for headaches.      Objective:    BP 180/72 mmHg  Pulse 84  Resp 28  Ht  (1.626 m)  Wt 136 lb (61.689 kg)  BMI 23.33 kg/m2  SpO2 94% Nursing note and vital signs reviewed.  Physical Exam  Constitutional: She is oriented to person, place, and time. She appears well-developed and well-nourished. She appears distressed.  Cardiovascular: Normal rate, regular rhythm, normal heart sounds and intact distal pulses.   Pulmonary/Chest: She is in respiratory distress. She has wheezes.  Neurological: She is alert and oriented to person, place, and time.  Skin: Skin is warm and dry.  Psychiatric: She has a normal mood and affect. Her behavior is normal. Judgment and thought content normal.       Assessment & Plan:   Problem List Items Addressed This Visit      Respiratory   COPD (chronic obstructive pulmonary disease) (HCC) - Primary    Symptoms and exam consistent with COPD exacerbation refractory to DuoNeb treatment. Patient continued to wheeze with accessory muscle usage and unable to to speak in complete sentences secondary to work of breathing. Recommend evaluation in the emergency department as patient may require IV medications and additional breathing treatments. Patient did not wish to go via ambulance willing to go in personal vehicle. Appears stable with oxygen level of 94%. Patient is in agreement with the plan and emergency department notified.      Relevant Medications   ipratropium (ATROVENT) nebulizer solution 0.5 mg (Completed)       I am having Ms. Man maintain her aspirin, calcium-vitamin D, multivitamin with minerals, vitamin E, NOVOFINE, Insulin Syringe-Needle U-100, vitamin C, psyllium,  Biotin, losartan, furosemide, amLODipine, glucose blood, insulin NPH-regular Human, albuterol, traZODone, and levothyroxine. We administered ipratropium.   Meds ordered this encounter  Medications  . ipratropium (ATROVENT) nebulizer solution 0.5 mg    Sig:      Follow-up: Return if symptoms worsen or fail to improve.  Jeanine Luz, FNP

## 2015-06-28 NOTE — ED Notes (Signed)
Pt complaint of increased SOB since Monday; audible wheezing with triage; pt winded with speaking; pt given breathing treatment just PTA.

## 2015-07-04 ENCOUNTER — Ambulatory Visit (INDEPENDENT_AMBULATORY_CARE_PROVIDER_SITE_OTHER): Payer: Medicare Other | Admitting: Internal Medicine

## 2015-07-04 ENCOUNTER — Encounter: Payer: Self-pay | Admitting: Internal Medicine

## 2015-07-04 VITALS — BP 170/70 | HR 84 | Temp 98.3°F | Resp 20 | Ht 61.0 in | Wt 139.0 lb

## 2015-07-04 DIAGNOSIS — E039 Hypothyroidism, unspecified: Secondary | ICD-10-CM

## 2015-07-04 DIAGNOSIS — E890 Postprocedural hypothyroidism: Secondary | ICD-10-CM

## 2015-07-04 DIAGNOSIS — M810 Age-related osteoporosis without current pathological fracture: Secondary | ICD-10-CM | POA: Diagnosis not present

## 2015-07-04 DIAGNOSIS — J449 Chronic obstructive pulmonary disease, unspecified: Secondary | ICD-10-CM | POA: Diagnosis not present

## 2015-07-04 DIAGNOSIS — I10 Essential (primary) hypertension: Secondary | ICD-10-CM

## 2015-07-04 MED ORDER — PREDNISONE 20 MG PO TABS: 40.0000 mg | ORAL_TABLET | Freq: Every day | ORAL | Status: DC

## 2015-07-04 MED ORDER — LOSARTAN POTASSIUM 100 MG PO TABS: 100.0000 mg | ORAL_TABLET | Freq: Every day | ORAL | Status: DC

## 2015-07-04 MED ORDER — PANTOPRAZOLE SODIUM 40 MG PO TBEC: 40.0000 mg | DELAYED_RELEASE_TABLET | Freq: Every day | ORAL | Status: DC

## 2015-07-04 NOTE — Patient Instructions (Addendum)
We have sent in losartan 100 mg. Once you get this medicine you will take 1 pill daily. This will replace the losartan you are now taking 1 and 1/2 pill daily. You can take 2 pills daily of it until it is gone.   We have sent in protonix to take for the stomach. This is 1 pill daily that helps with the acid in the stomach.   We have sent in prednisone that you only need to fill if you start feeling worse or do not feel better.   Come for labs in about 1 month to check the thyroid levels.

## 2015-07-04 NOTE — Progress Notes (Signed)
Pre visit review using our clinic review tool, if applicable. No additional management support is needed unless otherwise documented below in the visit note. 

## 2015-07-04 NOTE — Progress Notes (Signed)
   Subjective:    Patient ID: [name], female    DOB: 07/12/1917, 82 y.o.   MRN: [id number]  HPI The patient is a 82 YO female coming in for ER follow up (in for COPD and breathing and given nebulizer treatment, not given antibiotics or steroids). She is doing some better. She is still having more SOB on exertion. She is coughing just a little but overall that is better. She is still using the inhaler much more than usual. Several times per day. The ER was also concerned about her BP being slightly high and they wanted to ask about that. No chest pains, headaches. No abdominal pain, nausea, vomiting, diarrhea, constipation. She was supposed to be taking synthroid 50 mcg daily but she has gone back to taking 100 mcg daily synthroid as she thinks the change is what caused her to feel bad and her breathing.   Review of Systems  Constitutional: Positive for activity change. Negative for fever, appetite change, fatigue and unexpected weight change.  HENT: Negative for congestion, ear discharge, ear pain, rhinorrhea, sinus pressure, sore throat and trouble swallowing.   Eyes: Negative.   Respiratory: Positive for shortness of breath. Negative for cough, chest tightness and wheezing.        Improving  Cardiovascular: Negative for chest pain, palpitations and leg swelling.  Gastrointestinal: Negative for nausea, vomiting, abdominal pain, diarrhea, constipation and abdominal distention.  Musculoskeletal: Positive for arthralgias and gait problem. Negative for myalgias and back pain.  Skin: Negative.   Neurological: Positive for numbness. Negative for dizziness, light-headedness and headaches.       Memory change  Psychiatric/Behavioral: Negative.       Objective:   Physical Exam  Constitutional: She is oriented to person, place, and time. She appears well-developed and well-nourished.  frail  HENT:  Head: Normocephalic and atraumatic.  Eyes: EOM are normal.  Neck: Normal range of motion.    Cardiovascular: Normal rate and regular rhythm.   Pulmonary/Chest: Effort normal. No respiratory distress. She has no wheezes. She has no rales. She exhibits no tenderness.  Improved from prior  Abdominal: Soft. She exhibits no distension. There is no tenderness. There is no rebound.  Musculoskeletal: She exhibits no edema.  Neurological: She is alert and oriented to person, place, and time. Coordination abnormal.  Uses 4 prong cane to walk  Skin: Skin is warm and dry.   Filed Vitals:   07/04/15 1337 07/04/15 1407  BP: 178/78 170/70  Pulse: 84   Temp: 98.3 F (36.8 C)   TempSrc: Oral   Resp: 20   Height: 5\' 1"  (1.549 m)   Weight: 139 lb (63.05 kg)   SpO2: 98%       Assessment & Plan:

## 2015-07-05 NOTE — Assessment & Plan Note (Signed)
BP slightly above goal and likely due to COPD flare currently. Taking losartan which we increase to 100 mg daily from 75 mg daily today and amlodipine 10 mg daily and lasix 40 mg daily. Complicated by CKD. Not a candidate for beta blocker with her breathing.

## 2015-07-05 NOTE — Assessment & Plan Note (Signed)
Lung exam improved from description prior. Rx for prednisone in case the breathing worsens and we talked about the reasons to start taking that with her and her son in the office today. They will continue the albuterol as needed. Talked to them about another inhaler for maintenance and they are not interested today.

## 2015-07-05 NOTE — Assessment & Plan Note (Signed)
We talked about how steroids can make this worse and she wants to wait and see if her breathing continues to improve.

## 2015-07-05 NOTE — Assessment & Plan Note (Signed)
Previous T4 was 2.7 which is concerning given her age and she was asked to reduce to 50 mcg daily synthroid which she did for 2 weeks before changing back to 100 mcg daily since she did not like how she felt. She is still taking 100 mcg daily and will come for labs in about 1 month. If levels still high we will reduce to 75 mcg daily and she understands that this is a serious problem to have too much thyroid hormone. It is unclear if she is taking meds accurately since she does her own medicines.

## 2015-12-11 ENCOUNTER — Encounter: Payer: Self-pay | Admitting: Internal Medicine

## 2015-12-11 ENCOUNTER — Ambulatory Visit (INDEPENDENT_AMBULATORY_CARE_PROVIDER_SITE_OTHER): Payer: Medicare Other | Admitting: Internal Medicine

## 2015-12-11 DIAGNOSIS — J441 Chronic obstructive pulmonary disease with (acute) exacerbation: Secondary | ICD-10-CM | POA: Diagnosis not present

## 2015-12-11 MED ORDER — IPRATROPIUM-ALBUTEROL 0.5-2.5 (3) MG/3ML IN SOLN: 3.0000 mL | Freq: Once | RESPIRATORY_TRACT | Status: DC

## 2015-12-11 MED ORDER — DOXYCYCLINE HYCLATE 100 MG PO TABS: 100.0000 mg | ORAL_TABLET | Freq: Two times a day (BID) | ORAL | 0 refills | Status: DC

## 2015-12-11 MED ORDER — PREDNISONE 20 MG PO TABS: 40.0000 mg | ORAL_TABLET | Freq: Every day | ORAL | 0 refills | Status: DC

## 2015-12-11 MED ORDER — HYDROCODONE-HOMATROPINE 5-1.5 MG/5ML PO SYRP: 5.0000 mL | ORAL_SOLUTION | Freq: Three times a day (TID) | ORAL | 0 refills | Status: DC | PRN

## 2015-12-11 NOTE — Patient Instructions (Signed)
We have given you the cough syrup prescription today.   We have sent in prednisone. Take 2 pills a day for the next 5 days.   We have sent in the antibiotic called doxycycline. Take 1 pill daily for the next 10 days.   If your breathing does not improve or worsens call the office back again or go to the emergency room if you cannot breathe.

## 2015-12-11 NOTE — Progress Notes (Signed)
Pre visit review using our clinic review tool, if applicable. No additional management support is needed unless otherwise documented below in the visit note. 

## 2015-12-11 NOTE — Assessment & Plan Note (Signed)
With exacerbation today. Given duoneb in the office with good results. Has albuterol at home. Given rx for hycodan, prednisone and doxycycline. Given age she is asked to return if no improvement in 2-3 days or if any worsening. For severe SOB she knows to go to the ER.

## 2015-12-11 NOTE — Progress Notes (Signed)
   Subjective:    Patient ID: [name], female    DOB: 07/25/1917, 82 y.o.   MRN: [id number]  HPI The patient is a 82 YO female coming in for SOB for about 2 weeks. She does have COPD and no one else around her sick. She denies fevers or chills. She is coughing a lot more. She is having SOB and using her inhaler more. She is having some drainage as well and pain in her right ear. Overall symptoms are worsening. Activity is limited. Some speaking is limited by breath. Still eating and drinking well.   Review of Systems  Constitutional: Positive for activity change and fatigue. Negative for appetite change, chills, fever and unexpected weight change.  HENT: Positive for congestion, mouth sores and rhinorrhea. Negative for ear discharge, ear pain, postnasal drip, sinus pain, sinus pressure, sore throat and trouble swallowing.   Eyes: Negative.   Respiratory: Positive for cough, shortness of breath and wheezing. Negative for apnea and chest tightness.   Cardiovascular: Negative.   Gastrointestinal: Negative.   Musculoskeletal: Negative.   Neurological: Negative.       Objective:   Physical Exam  Constitutional: She is oriented to person, place, and time. She appears well-developed and well-nourished.  HENT:  Head: Normocephalic and atraumatic.  Right ear blocked with wax, TMs normal after disimpaction.   Eyes: EOM are normal.  Neck: Normal range of motion. No JVD present.  Cardiovascular: Normal rate and regular rhythm.   Pulmonary/Chest: Effort normal. No respiratory distress. She has wheezes. She has no rales. She exhibits no tenderness.  Diffuse wheezing which does not clear with cough, after nebulizer treatment wheezing is diminished but still some expiratory wheezing. Some SOB with talking but is able to speak in full sentences.   Abdominal: Soft. Bowel sounds are normal.  Lymphadenopathy:    She has no cervical adenopathy.  Neurological: She is alert and oriented to person,  place, and time. Coordination normal.  Skin: Skin is warm and dry.   Vitals:   12/11/15 1110  BP: (!) 168/60  Pulse: 93  Resp: (!) 24  Temp: 98.3 F (36.8 C)  TempSrc: Oral  SpO2: 95%  Weight: 139 lb 12.8 oz (63.4 kg)  Height: 5\' 3"  (1.6 m)      Assessment & Plan:

## 2015-12-31 ENCOUNTER — Other Ambulatory Visit: Payer: Self-pay | Admitting: Internal Medicine

## 2015-12-31 DIAGNOSIS — J441 Chronic obstructive pulmonary disease with (acute) exacerbation: Secondary | ICD-10-CM

## 2016-01-16 ENCOUNTER — Encounter: Payer: Self-pay | Admitting: Internal Medicine

## 2016-01-16 ENCOUNTER — Ambulatory Visit (INDEPENDENT_AMBULATORY_CARE_PROVIDER_SITE_OTHER): Payer: Medicare Other | Admitting: Internal Medicine

## 2016-01-16 VITALS — BP 172/62 | HR 96 | Temp 98.3°F | Resp 20 | Ht 63.0 in | Wt 140.0 lb

## 2016-01-16 DIAGNOSIS — J441 Chronic obstructive pulmonary disease with (acute) exacerbation: Secondary | ICD-10-CM

## 2016-01-16 MED ORDER — IPRATROPIUM-ALBUTEROL 0.5-2.5 (3) MG/3ML IN SOLN
3.0000 mL | Freq: Once | RESPIRATORY_TRACT | Status: AC
  Administered 2016-01-16: 3 mL via RESPIRATORY_TRACT

## 2016-01-16 MED ORDER — PREDNISONE 20 MG PO TABS: 40.0000 mg | ORAL_TABLET | Freq: Every day | ORAL | 0 refills | Status: DC

## 2016-01-16 MED ORDER — HYDROCODONE-HOMATROPINE 5-1.5 MG/5ML PO SYRP: 5.0000 mL | ORAL_SOLUTION | Freq: Three times a day (TID) | ORAL | 0 refills | Status: DC | PRN

## 2016-01-16 MED ORDER — DOXYCYCLINE HYCLATE 100 MG PO TABS: 100.0000 mg | ORAL_TABLET | Freq: Two times a day (BID) | ORAL | 0 refills | Status: DC

## 2016-01-16 NOTE — Patient Instructions (Signed)
We have sent in prednisone to take 2 pills daily for 5 days.  We have also sent in doxycycline which is the antibiotic. Take 1 pill twice a day for 10 days.   We have given you the cough syrup to use as needed for cough.   If you are not getting better in 2-3 days call the office back or return.

## 2016-01-16 NOTE — Progress Notes (Signed)
   Subjective:    Patient ID: [name], female    DOB: 07/19/1917, 83 y.o.   MRN: [id number]  HPI The patient is a 83 YO female coming in for breathing problems. She does have fairly severe COPD with multiple exacerbations in the last year. Last exacerbation was 6 weeks ago. She noticed the breathing problem about 7-8 days ago. Overall worsening and more SOB. Cough with some production. She is using albuterol more often and it is helpful for short amounts of time. She denies fevers or chills.   Review of Systems  Constitutional: Positive for activity change. Negative for appetite change, chills, fatigue, fever and unexpected weight change.  HENT: Positive for congestion and rhinorrhea. Negative for ear discharge, ear pain, postnasal drip, sinus pain, sinus pressure, sore throat and tinnitus.   Eyes: Negative.   Respiratory: Positive for cough, shortness of breath and wheezing. Negative for chest tightness.   Cardiovascular: Negative.   Gastrointestinal: Negative.   Musculoskeletal: Negative.       Objective:   Physical Exam  Constitutional: She is oriented to person, place, and time. She appears well-developed and well-nourished.  HENT:  Head: Normocephalic and atraumatic.  Eyes: EOM are normal.  Neck: Normal range of motion.  Cardiovascular: Normal rate and regular rhythm.   Pulmonary/Chest: Effort normal. No respiratory distress. She has wheezes. She has no rales. She exhibits no tenderness.  Scattered wheezing which does not clear with cough, after nebulizer wheezing is diminished although rhonchi are more prominent.   Abdominal: Soft. She exhibits no distension. There is no tenderness. There is no rebound.  Neurological: She is alert and oriented to person, place, and time.  Skin: Skin is warm and dry.   Vitals:   01/16/16 1404  BP: (!) 172/62  Pulse: 96  Resp: 20  Temp: 98.3 F (36.8 C)  TempSrc: Oral  SpO2: 98%  Weight: 140 lb (63.5 kg)  Height: 5\' 3"  (1.6 m)        Assessment & Plan:  Duoneb given at visit

## 2016-01-16 NOTE — Assessment & Plan Note (Signed)
With acute exacerbation and rx for hycodan, prednisone, and doxycycline. She is frail and needs close follow up if not improving. Advised if worsening or no improvement call or seek care immediately.

## 2016-01-16 NOTE — Progress Notes (Signed)
Pre visit review using our clinic review tool, if applicable. No additional management support is needed unless otherwise documented below in the visit note. 

## 2016-01-17 ENCOUNTER — Other Ambulatory Visit: Payer: Self-pay | Admitting: Internal Medicine

## 2016-01-28 ENCOUNTER — Other Ambulatory Visit: Payer: Self-pay | Admitting: Internal Medicine

## 2016-01-31 ENCOUNTER — Other Ambulatory Visit: Payer: Self-pay | Admitting: Internal Medicine

## 2016-03-24 ENCOUNTER — Telehealth: Payer: Self-pay | Admitting: Internal Medicine

## 2016-03-24 MED ORDER — GLUCOSE BLOOD VI STRP: ORAL_STRIP | 11 refills | Status: DC

## 2016-03-24 NOTE — Telephone Encounter (Signed)
sent 

## 2016-03-24 NOTE — Telephone Encounter (Signed)
A refill for glucose blood (ONE TOUCH ULTRA TEST) test strips was requested by Aurther Lofterry (the pts son). [facility] [address]. Please advise. Thanks [employer]

## 2016-04-05 ENCOUNTER — Other Ambulatory Visit: Payer: Self-pay | Admitting: Internal Medicine

## 2016-04-07 ENCOUNTER — Other Ambulatory Visit: Payer: Self-pay | Admitting: Internal Medicine

## 2016-04-08 ENCOUNTER — Other Ambulatory Visit: Payer: Self-pay | Admitting: Internal Medicine

## 2016-04-15 ENCOUNTER — Encounter: Payer: Self-pay | Admitting: Internal Medicine

## 2016-04-15 ENCOUNTER — Ambulatory Visit (INDEPENDENT_AMBULATORY_CARE_PROVIDER_SITE_OTHER): Payer: Medicare Other | Admitting: Internal Medicine

## 2016-04-15 ENCOUNTER — Other Ambulatory Visit (INDEPENDENT_AMBULATORY_CARE_PROVIDER_SITE_OTHER): Payer: Medicare Other

## 2016-04-15 VITALS — BP 160/76 | HR 91 | Temp 98.3°F | Resp 16 | Ht 63.0 in | Wt 136.0 lb

## 2016-04-15 DIAGNOSIS — E890 Postprocedural hypothyroidism: Secondary | ICD-10-CM

## 2016-04-15 DIAGNOSIS — E039 Hypothyroidism, unspecified: Secondary | ICD-10-CM | POA: Diagnosis not present

## 2016-04-15 LAB — TSH: TSH: 4.27 u[IU]/mL (ref 0.35–4.50)

## 2016-04-15 LAB — T4, FREE: Free T4: 2.34 ng/dL — ABNORMAL HIGH (ref 0.60–1.60)

## 2016-04-15 NOTE — Progress Notes (Signed)
Pre visit review using our clinic review tool, if applicable. No additional management support is needed unless otherwise documented below in the visit note. 

## 2016-04-15 NOTE — Patient Instructions (Signed)
We are checking the thyroid levels today and can only send in thyroid medicine once the results are back.   Depending on the levels we likely will not be able to send in 100 mcg thyroid medicine because last time your levels were toxically high.

## 2016-04-15 NOTE — Progress Notes (Signed)
   Subjective:    Patient ID: [name], female    DOB: 07-29-1917, 83 y.o.   MRN: [id number]  HPI The patient is a 83 YO female coming in for problems with her thyroid medicine. She was taking 100 mcg daily synthroid but last summer her levels were toxic and her dosage was reduced to 50 mcg daily. She did this for about 2 weeks and could not stand how she felt. She then on her own started taking 100 mcg daily again. We asked her to return for labs in 4 weeks to see if levels were still high. She never returned for these labs. We have not filled the 100 mcg since. She is now out of medicine for about 3-4 days as she did not have refills on the 100 mcg synthroid and her pharmacist gave her medicine without a prescription per her request. She states that she has been on this for about 40 years since surgical removal.   Review of Systems  Constitutional: Negative.   Respiratory: Negative.   Cardiovascular: Negative.   Gastrointestinal: Negative.   Musculoskeletal: Negative.   Skin: Negative.   Neurological: Negative.       Objective:   Physical Exam  Constitutional: She appears well-developed and well-nourished.  HENT:  Head: Normocephalic and atraumatic.  Eyes: EOM are normal.  Cardiovascular: Normal rate and regular rhythm.   Pulmonary/Chest: Effort normal. No respiratory distress. She has no wheezes. She has no rales.  Abdominal: Soft.  Neurological: She is alert. Coordination normal.  Skin: Skin is warm and dry.   Vitals:   04/15/16 0930 04/15/16 1011  BP: (!) 170/70 (!) 160/76  Pulse: 91   Resp: 16   Temp: 98.3 F (36.8 C)   TempSrc: Oral   SpO2: 96%   Weight: 136 lb (61.7 kg)   Height:  (1.6 m)       Assessment & Plan:

## 2016-04-15 NOTE — Assessment & Plan Note (Signed)
Talked to her today about the significant risk of too much thyroid hormone as well as her son. Cannot fill the 100 mcg synthroid without labs. Checking TSH and free T4 today and will likely need adjustment. She adamantly refuses to go to 50 mcg daily so if levels still high we will go to 75 mcg daily and then recheck levels in 6 weeks and then go down again if needed. If levels still high will call pharmacy to ensure no more dispenses of 100 mcg thyroid medicine as this can be toxic to her.

## 2016-04-16 ENCOUNTER — Other Ambulatory Visit: Payer: Self-pay | Admitting: Internal Medicine

## 2016-04-16 MED ORDER — LEVOTHYROXINE SODIUM 75 MCG PO TABS: 75.0000 ug | ORAL_TABLET | Freq: Every day | ORAL | 0 refills | Status: DC

## 2016-04-28 ENCOUNTER — Other Ambulatory Visit: Payer: Self-pay | Admitting: Internal Medicine

## 2016-05-12 ENCOUNTER — Other Ambulatory Visit: Payer: Self-pay | Admitting: Internal Medicine

## 2016-05-27 ENCOUNTER — Ambulatory Visit (INDEPENDENT_AMBULATORY_CARE_PROVIDER_SITE_OTHER): Payer: Medicare Other | Admitting: Internal Medicine

## 2016-05-27 ENCOUNTER — Other Ambulatory Visit (INDEPENDENT_AMBULATORY_CARE_PROVIDER_SITE_OTHER): Payer: Medicare Other

## 2016-05-27 ENCOUNTER — Encounter: Payer: Self-pay | Admitting: Internal Medicine

## 2016-05-27 VITALS — BP 142/60 | HR 84 | Temp 98.3°F | Resp 14 | Ht 63.0 in | Wt 136.0 lb

## 2016-05-27 DIAGNOSIS — E1159 Type 2 diabetes mellitus with other circulatory complications: Secondary | ICD-10-CM

## 2016-05-27 DIAGNOSIS — E890 Postprocedural hypothyroidism: Secondary | ICD-10-CM | POA: Diagnosis not present

## 2016-05-27 DIAGNOSIS — Z0000 Encounter for general adult medical examination without abnormal findings: Secondary | ICD-10-CM | POA: Diagnosis not present

## 2016-05-27 DIAGNOSIS — I10 Essential (primary) hypertension: Secondary | ICD-10-CM | POA: Diagnosis not present

## 2016-05-27 DIAGNOSIS — E1169 Type 2 diabetes mellitus with other specified complication: Secondary | ICD-10-CM

## 2016-05-27 DIAGNOSIS — E785 Hyperlipidemia, unspecified: Secondary | ICD-10-CM

## 2016-05-27 LAB — COMPREHENSIVE METABOLIC PANEL
ALBUMIN: 4.4 g/dL (ref 3.5–5.2)
ALT: 19 U/L (ref 0–35)
AST: 20 U/L (ref 0–37)
Alkaline Phosphatase: 79 U/L (ref 39–117)
BUN: 33 mg/dL — ABNORMAL HIGH (ref 6–23)
CALCIUM: 10.7 mg/dL — AB (ref 8.4–10.5)
CO2: 27 mEq/L (ref 19–32)
CREATININE: 1.67 mg/dL — AB (ref 0.40–1.20)
Chloride: 100 mEq/L (ref 96–112)
GFR: 30.15 mL/min — ABNORMAL LOW (ref >60.00)
Glucose, Bld: 274 mg/dL — ABNORMAL HIGH (ref 70–99)
POTASSIUM: 3.9 meq/L (ref 3.5–5.1)
Sodium: 136 mEq/L (ref 135–145)
Total Bilirubin: 0.7 mg/dL (ref 0.2–1.2)
Total Protein: 7.4 g/dL (ref 6.0–8.3)

## 2016-05-27 LAB — CBC
HEMATOCRIT: 47.1 % — AB (ref 36.0–46.0)
Hemoglobin: 15.6 g/dL — ABNORMAL HIGH (ref 12.0–15.0)
MCHC: 33.1 g/dL (ref 30.0–36.0)
MCV: 83.4 fl (ref 78.0–100.0)
Platelets: 210 10*3/uL (ref 150.0–400.0)
RBC: 5.64 Mil/uL — AB (ref 3.87–5.11)
RDW: 15.6 % — ABNORMAL HIGH (ref 11.5–15.5)
WBC: 9.5 10*3/uL (ref 4.0–10.5)

## 2016-05-27 LAB — HEMOGLOBIN A1C: HEMOGLOBIN A1C: 8.5 % — AB (ref 4.6–6.5)

## 2016-05-27 LAB — LIPID PANEL
CHOLESTEROL: 272 mg/dL — AB (ref 0–200)
HDL: 55.2 mg/dL (ref >39.00)
NonHDL: 216.9
TRIGLYCERIDES: 219 mg/dL — AB (ref 0.0–149.0)
Total CHOL/HDL Ratio: 5
VLDL: 43.8 mg/dL — AB (ref 0.0–40.0)

## 2016-05-27 LAB — LDL CHOLESTEROL, DIRECT: Direct LDL: 172 mg/dL

## 2016-05-27 LAB — T4, FREE: Free T4: 2.91 ng/dL — ABNORMAL HIGH (ref 0.60–1.60)

## 2016-05-27 LAB — TSH: TSH: 10.2 u[IU]/mL — AB (ref 0.35–4.50)

## 2016-05-27 NOTE — Patient Instructions (Signed)
Don't forget to get the eyes checked.   We are checking the labs today and will call you back with the results.   Health Maintenance, Female Adopting a healthy lifestyle and getting preventive care can go a long way to promote health and wellness. Talk with your health care provider about what schedule of regular examinations is right for you. This is a good chance for you to check in with your provider about disease prevention and staying healthy. In between checkups, there are plenty of things you can do on your own. Experts have done a lot of research about which lifestyle changes and preventive measures are most likely to keep you healthy. Ask your health care provider for more information. Weight and diet Eat a healthy diet  Be sure to include plenty of vegetables, fruits, low-fat dairy products, and lean protein.  Do not eat a lot of foods high in solid fats, added sugars, or salt.  Get regular exercise. This is one of the most important things you can do for your health.  Most adults should exercise for at least 150 minutes each week. The exercise should increase your heart rate and make you sweat (moderate-intensity exercise).  Most adults should also do strengthening exercises at least twice a week. This is in addition to the moderate-intensity exercise. Maintain a healthy weight  Body mass index (BMI) is a measurement that can be used to identify possible weight problems. It estimates body fat based on height and weight. Your health care provider can help determine your BMI and help you achieve or maintain a healthy weight.  For females 68 years of age and older:  A BMI below 18.5 is considered underweight.  A BMI of 18.5 to 24.9 is normal.  A BMI of 25 to 29.9 is considered overweight.  A BMI of 30 and above is considered obese. Watch levels of cholesterol and blood lipids  You should start having your blood tested for lipids and cholesterol at 81 years of age, then have  this test every 5 years.  You may need to have your cholesterol levels checked more often if:  Your lipid or cholesterol levels are high.  You are older than 81 years of age.  You are at high risk for heart disease. Cancer screening Lung Cancer  Lung cancer screening is recommended for adults 8-59 years old who are at high risk for lung cancer because of a history of smoking.  A yearly low-dose CT scan of the lungs is recommended for people who:  Currently smoke.  Have quit within the past 15 years.  Have at least a 30-pack-year history of smoking. A pack year is smoking an average of one pack of cigarettes a day for 1 year.  Yearly screening should continue until it has been 15 years since you quit.  Yearly screening should stop if you develop a health problem that would prevent you from having lung cancer treatment. Breast Cancer  Practice breast self-awareness. This means understanding how your breasts normally appear and feel.  It also means doing regular breast self-exams. Let your health care provider know about any changes, no matter how small.  If you are in your 20s or 30s, you should have a clinical breast exam (CBE) by a health care provider every 1-3 years as part of a regular health exam.  If you are 51 or older, have a CBE every year. Also consider having a breast X-ray (mammogram) every year.  If you have a  family history of breast cancer, talk to your health care provider about genetic screening.  If you are at high risk for breast cancer, talk to your health care provider about having an MRI and a mammogram every year.  Breast cancer gene (BRCA) assessment is recommended for women who have family members with BRCA-related cancers. BRCA-related cancers include:  Breast.  Ovarian.  Tubal.  Peritoneal cancers.  Results of the assessment will determine the need for genetic counseling and BRCA1 and BRCA2 testing. Cervical Cancer  Your health care  provider may recommend that you be screened regularly for cancer of the pelvic organs (ovaries, uterus, and vagina). This screening involves a pelvic examination, including checking for microscopic changes to the surface of your cervix (Pap test). You may be encouraged to have this screening done every 3 years, beginning at age 20.  For women ages 9-65, health care providers may recommend pelvic exams and Pap testing every 3 years, or they may recommend the Pap and pelvic exam, combined with testing for human papilloma virus (HPV), every 5 years. Some types of HPV increase your risk of cervical cancer. Testing for HPV may also be done on women of any age with unclear Pap test results.  Other health care providers may not recommend any screening for nonpregnant women who are considered low risk for pelvic cancer and who do not have symptoms. Ask your health care provider if a screening pelvic exam is right for you.  If you have had past treatment for cervical cancer or a condition that could lead to cancer, you need Pap tests and screening for cancer for at least 20 years after your treatment. If Pap tests have been discontinued, your risk factors (such as having a new sexual partner) need to be reassessed to determine if screening should resume. Some women have medical problems that increase the chance of getting cervical cancer. In these cases, your health care provider may recommend more frequent screening and Pap tests. Colorectal Cancer  This type of cancer can be detected and often prevented.  Routine colorectal cancer screening usually begins at 81 years of age and continues through 81 years of age.  Your health care provider may recommend screening at an earlier age if you have risk factors for colon cancer.  Your health care provider may also recommend using home test kits to check for hidden blood in the stool.  A small camera at the end of a tube can be used to examine your colon directly  (sigmoidoscopy or colonoscopy). This is done to check for the earliest forms of colorectal cancer.  Routine screening usually begins at age 58.  Direct examination of the colon should be repeated every 5-10 years through 81 years of age. However, you may need to be screened more often if early forms of precancerous polyps or small growths are found. Skin Cancer  Check your skin from head to toe regularly.  Tell your health care provider about any new moles or changes in moles, especially if there is a change in a mole's shape or color.  Also tell your health care provider if you have a mole that is larger than the size of a pencil eraser.  Always use sunscreen. Apply sunscreen liberally and repeatedly throughout the day.  Protect yourself by wearing long sleeves, pants, a wide-brimmed hat, and sunglasses whenever you are outside. Heart disease, diabetes, and high blood pressure  High blood pressure causes heart disease and increases the risk of stroke. High blood  pressure is more likely to develop in:  People who have blood pressure in the high end of the normal range (130-139/85-89 mm Hg).  People who are overweight or obese.  People who are African American.  If you are 64-2 years of age, have your blood pressure checked every 3-5 years. If you are 108 years of age or older, have your blood pressure checked every year. You should have your blood pressure measured twice-once when you are at a hospital or clinic, and once when you are not at a hospital or clinic. Record the average of the two measurements. To check your blood pressure when you are not at a hospital or clinic, you can use:  An automated blood pressure machine at a pharmacy.  A home blood pressure monitor.  If you are between 40 years and 55 years old, ask your health care provider if you should take aspirin to prevent strokes.  Have regular diabetes screenings. This involves taking a blood sample to check your  fasting blood sugar level.  If you are at a normal weight and have a low risk for diabetes, have this test once every three years after 81 years of age.  If you are overweight and have a high risk for diabetes, consider being tested at a younger age or more often. Preventing infection Hepatitis B  If you have a higher risk for hepatitis B, you should be screened for this virus. You are considered at high risk for hepatitis B if:  You were born in a country where hepatitis B is common. Ask your health care provider which countries are considered high risk.  Your parents were born in a high-risk country, and you have not been immunized against hepatitis B (hepatitis B vaccine).  You have HIV or AIDS.  You use needles to inject street drugs.  You live with someone who has hepatitis B.  You have had sex with someone who has hepatitis B.  You get hemodialysis treatment.  You take certain medicines for conditions, including cancer, organ transplantation, and autoimmune conditions. Hepatitis C  Blood testing is recommended for:  Everyone born from [birth year] through 1963.  Anyone with known risk factors for hepatitis C. Sexually transmitted infections (STIs)  You should be screened for sexually transmitted infections (STIs) including gonorrhea and chlamydia if:  You are sexually active and are younger than 81 years of age.  You are older than 81 years of age and your health care provider tells you that you are at risk for this type of infection.  Your sexual activity has changed since you were last screened and you are at an increased risk for chlamydia or gonorrhea. Ask your health care provider if you are at risk.  If you do not have HIV, but are at risk, it may be recommended that you take a prescription medicine daily to prevent HIV infection. This is called pre-exposure prophylaxis (PrEP). You are considered at risk if:  You are sexually active and do not regularly use condoms or  know the HIV status of your partner(s).  You take drugs by injection.  You are sexually active with a partner who has HIV. Talk with your health care provider about whether you are at high risk of being infected with HIV. If you choose to begin PrEP, you should first be tested for HIV. You should then be tested every 3 months for as long as you are taking PrEP. Pregnancy  If you are premenopausal and you may  become pregnant, ask your health care provider about preconception counseling.  If you may become pregnant, take 400 to 800 micrograms (mcg) of folic acid every day.  If you want to prevent pregnancy, talk to your health care provider about birth control (contraception). Osteoporosis and menopause  Osteoporosis is a disease in which the bones lose minerals and strength with aging. This can result in serious bone fractures. Your risk for osteoporosis can be identified using a bone density scan.  If you are 15 years of age or older, or if you are at risk for osteoporosis and fractures, ask your health care provider if you should be screened.  Ask your health care provider whether you should take a calcium or vitamin D supplement to lower your risk for osteoporosis.  Menopause may have certain physical symptoms and risks.  Hormone replacement therapy may reduce some of these symptoms and risks. Talk to your health care provider about whether hormone replacement therapy is right for you. Follow these instructions at home:  Schedule regular health, dental, and eye exams.  Stay current with your immunizations.  Do not use any tobacco products including cigarettes, chewing tobacco, or electronic cigarettes.  If you are pregnant, do not drink alcohol.  If you are breastfeeding, limit how much and how often you drink alcohol.  Limit alcohol intake to no more than 1 drink per day for nonpregnant women. One drink equals 12 ounces of beer, 5 ounces of wine, or 1 ounces of hard  liquor.  Do not use street drugs.  Do not share needles.  Ask your health care provider for help if you need support or information about quitting drugs.  Tell your health care provider if you often feel depressed.  Tell your health care provider if you have ever been abused or do not feel safe at home. This information is not intended to replace advice given to you by your health care provider. Make sure you discuss any questions you have with your health care provider. Document Released: 07/14/2010 Document Revised: 06/06/2015 Document Reviewed: 10/02/2014 Elsevier Interactive Patient Education  2017 [employer].

## 2016-05-27 NOTE — Progress Notes (Signed)
   Subjective:    Patient ID: [name], female    DOB: 05/15/1917, 83 y.o.   MRN: [id number]  HPI Here for medicare wellness and physical, no new complaints. Please see A/P for status and treatment of chronic medical problems.   Diet: diabetic Physical activity: sedentary Depression/mood screen: negative Hearing: intact to whispered voice, hearing aids in place Visual acuity: some blurred vision even with lens, does not perform annual eye exam  ADLs: capable Fall risk: none Home safety: good Cognitive evaluation: intact to orientation, naming, recall and repetition EOL planning: adv directives discussed, in place  I have personally reviewed and have noted 1. The patient's medical and social history - reviewed today no changes 2. Their use of alcohol, tobacco or illicit drugs 3. Their current medications and supplements 4. The patient's functional ability including ADL's, fall risks, home safety risks and hearing or visual impairment. 5. Diet and physical activities 6. Evidence for depression or mood disorders 7. Care team reviewed and updated (available in snapshot)  Review of Systems  Constitutional: Positive for activity change. Negative for appetite change, chills, fever and unexpected weight change.  HENT: Negative.   Eyes: Positive for visual disturbance.  Respiratory: Negative for cough, chest tightness and shortness of breath.   Cardiovascular: Negative for chest pain, palpitations and leg swelling.  Gastrointestinal: Negative for abdominal distention, abdominal pain, constipation, diarrhea, nausea and vomiting.  Musculoskeletal: Positive for arthralgias, back pain and gait problem. Negative for joint swelling, myalgias, neck pain and neck stiffness.  Skin: Negative.   Neurological: Positive for numbness. Negative for dizziness, seizures, speech difficulty, weakness, light-headedness and headaches.  Psychiatric/Behavioral: Negative.       Objective:   Physical Exam    Constitutional: She is oriented to person, place, and time. She appears well-developed and well-nourished.  HENT:  Head: Normocephalic and atraumatic.  Eyes: EOM are normal.  glasses  Neck: Normal range of motion.  Cardiovascular: Normal rate and regular rhythm.   Pulmonary/Chest: Effort normal and breath sounds normal. No respiratory distress. She has no wheezes. She has no rales.  Abdominal: Soft. Bowel sounds are normal. She exhibits no distension. There is no tenderness. There is no rebound.  Musculoskeletal: She exhibits no edema.  Neurological: She is alert and oriented to person, place, and time. Coordination abnormal.  Walker with seat when outside the house, cane in the house  Skin: Skin is warm and dry.  Psychiatric: She has a normal mood and affect.   Vitals:   05/27/16 0923  BP: (!) 142/60  Pulse: 84  Resp: 14  Temp: 98.3 F (36.8 C)  TempSrc: Oral  SpO2: 94%  Weight: 136 lb (61.7 kg)  Height: 5\' 3"  (1.6 m)      Assessment & Plan:

## 2016-05-28 NOTE — Assessment & Plan Note (Signed)
Taking amlodipine, lasix, losartan and BP at goal. Checking CMP and adjust if needed.

## 2016-05-28 NOTE — Assessment & Plan Note (Signed)
Checking TSH and free T4. She has decreased to 75 mcg daily and may need further reduction as last T4 supra-therapeutic.

## 2016-05-28 NOTE — Assessment & Plan Note (Signed)
Not on meds currently, LDL goal <100.

## 2016-05-28 NOTE — Assessment & Plan Note (Signed)
Aged out of colonoscopy, mammogram, pap smear. Pneumonia series complete. Tetanus up to date. Reminded about yearly flu shot. Counseled about shingrix and sun safety with mole surveillance. Counseled about home safety. Given 10 year screening recommendations.

## 2016-05-28 NOTE — Assessment & Plan Note (Addendum)
Checking Hga1c and adjust as needed. She is managing her insulin and has rare (1 every 2-3 months) low sugar to 60s and is able to get symptoms. She declines adjustment today on her 70/30 insulin. Complicated by vascular complications and neuropathy which is stable today. She has not had eye exam and reminded her about the importance of this.

## 2016-06-15 ENCOUNTER — Telehealth: Payer: Self-pay | Admitting: Internal Medicine

## 2016-06-15 ENCOUNTER — Other Ambulatory Visit (INDEPENDENT_AMBULATORY_CARE_PROVIDER_SITE_OTHER): Payer: Medicare Other

## 2016-06-15 DIAGNOSIS — E039 Hypothyroidism, unspecified: Secondary | ICD-10-CM | POA: Diagnosis not present

## 2016-06-15 LAB — T4, FREE: FREE T4: 0.74 ng/dL (ref 0.60–1.60)

## 2016-06-15 LAB — TSH: TSH: 16.67 u[IU]/mL — ABNORMAL HIGH (ref 0.35–4.50)

## 2016-06-15 NOTE — Telephone Encounter (Signed)
patient aware

## 2016-06-15 NOTE — Telephone Encounter (Signed)
Son states that patient was to come back to the lab to have thyroid testing done today.  Please follow up in regard.  Did not see orders in.

## 2016-06-15 NOTE — Telephone Encounter (Signed)
Orders placed.

## 2016-06-16 ENCOUNTER — Encounter: Payer: Self-pay | Admitting: Internal Medicine

## 2016-06-29 ENCOUNTER — Other Ambulatory Visit: Payer: Self-pay | Admitting: Internal Medicine

## 2016-06-30 NOTE — Telephone Encounter (Signed)
Routing to greg, please see dr crawfords note about not dispensing this---i'm not sure what to do with refill request, please advise, thanks

## 2016-07-09 ENCOUNTER — Other Ambulatory Visit: Payer: Self-pay | Admitting: Internal Medicine

## 2016-07-09 DIAGNOSIS — J441 Chronic obstructive pulmonary disease with (acute) exacerbation: Secondary | ICD-10-CM

## 2016-07-22 ENCOUNTER — Other Ambulatory Visit: Payer: Self-pay | Admitting: Internal Medicine

## 2016-10-02 ENCOUNTER — Other Ambulatory Visit: Payer: Self-pay | Admitting: Family

## 2017-01-08 ENCOUNTER — Other Ambulatory Visit: Payer: Self-pay | Admitting: Internal Medicine

## 2017-01-18 ENCOUNTER — Other Ambulatory Visit: Payer: Self-pay

## 2017-01-18 MED ORDER — FUROSEMIDE 40 MG PO TABS: 40.0000 mg | ORAL_TABLET | Freq: Every day | ORAL | 1 refills | Status: DC

## 2017-01-22 ENCOUNTER — Other Ambulatory Visit (INDEPENDENT_AMBULATORY_CARE_PROVIDER_SITE_OTHER): Payer: Medicare Other

## 2017-01-22 ENCOUNTER — Encounter: Payer: Self-pay | Admitting: Internal Medicine

## 2017-01-22 ENCOUNTER — Ambulatory Visit: Payer: Medicare Other | Admitting: Internal Medicine

## 2017-01-22 ENCOUNTER — Ambulatory Visit (INDEPENDENT_AMBULATORY_CARE_PROVIDER_SITE_OTHER)
Admission: RE | Admit: 2017-01-22 | Discharge: 2017-01-22 | Disposition: A | Discharge status: Home / Self Care | Payer: Medicare Other | Source: Ambulatory Visit | Attending: Internal Medicine | Admitting: Internal Medicine

## 2017-01-22 VITALS — BP 130/60 | HR 78 | Temp 97.5°F | Ht 63.0 in | Wt 140.0 lb

## 2017-01-22 DIAGNOSIS — J441 Chronic obstructive pulmonary disease with (acute) exacerbation: Secondary | ICD-10-CM

## 2017-01-22 DIAGNOSIS — R059 Cough, unspecified: Secondary | ICD-10-CM

## 2017-01-22 DIAGNOSIS — R05 Cough: Secondary | ICD-10-CM

## 2017-01-22 DIAGNOSIS — Z23 Encounter for immunization: Secondary | ICD-10-CM | POA: Diagnosis not present

## 2017-01-22 DIAGNOSIS — K219 Gastro-esophageal reflux disease without esophagitis: Secondary | ICD-10-CM | POA: Diagnosis not present

## 2017-01-22 DIAGNOSIS — N289 Disorder of kidney and ureter, unspecified: Secondary | ICD-10-CM | POA: Diagnosis not present

## 2017-01-22 LAB — HEMOGLOBIN A1C: HEMOGLOBIN A1C: 8.5 % — AB (ref 4.6–6.5)

## 2017-01-22 LAB — CBC
HCT: 44.2 % (ref 36.0–46.0)
Hemoglobin: 14.3 g/dL (ref 12.0–15.0)
MCHC: 32.3 g/dL (ref 30.0–36.0)
MCV: 85.8 fl (ref 78.0–100.0)
Platelets: 204 10*3/uL (ref 150.0–400.0)
RBC: 5.15 Mil/uL — ABNORMAL HIGH (ref 3.87–5.11)
RDW: 15.6 % — AB (ref 11.5–15.5)
WBC: 11.2 10*3/uL — AB (ref 4.0–10.5)

## 2017-01-22 LAB — COMPREHENSIVE METABOLIC PANEL
ALK PHOS: 85 U/L (ref 39–117)
ALT: 19 U/L (ref 0–35)
AST: 19 U/L (ref 0–37)
Albumin: 4 g/dL (ref 3.5–5.2)
BILIRUBIN TOTAL: 0.6 mg/dL (ref 0.2–1.2)
BUN: 30 mg/dL — ABNORMAL HIGH (ref 6–23)
CO2: 27 meq/L (ref 19–32)
Calcium: 9.7 mg/dL (ref 8.4–10.5)
Chloride: 100 mEq/L (ref 96–112)
Creatinine, Ser: 1.54 mg/dL — ABNORMAL HIGH (ref 0.40–1.20)
GFR: 33.06 mL/min — ABNORMAL LOW (ref >60.00)
GLUCOSE: 284 mg/dL — AB (ref 70–99)
Potassium: 4 mEq/L (ref 3.5–5.1)
Sodium: 135 mEq/L (ref 135–145)
TOTAL PROTEIN: 7.2 g/dL (ref 6.0–8.3)

## 2017-01-22 LAB — TSH: TSH: 18.79 u[IU]/mL — AB (ref 0.35–4.50)

## 2017-01-22 LAB — T4, FREE: FREE T4: 1.35 ng/dL (ref 0.60–1.60)

## 2017-01-22 MED ORDER — INSULIN ISOPHANE & REGULAR (HUMAN 70-30)100 UNIT/ML KWIKPEN: PEN_INJECTOR | SUBCUTANEOUS | 11 refills | Status: DC

## 2017-01-22 MED ORDER — PANTOPRAZOLE SODIUM 40 MG PO TBEC: 40.0000 mg | DELAYED_RELEASE_TABLET | Freq: Every day | ORAL | 3 refills | Status: DC

## 2017-01-22 MED ORDER — DOXYCYCLINE HYCLATE 100 MG PO TABS: 100.0000 mg | ORAL_TABLET | Freq: Two times a day (BID) | ORAL | 0 refills | Status: DC

## 2017-01-22 MED ORDER — IPRATROPIUM-ALBUTEROL 0.5-2.5 (3) MG/3ML IN SOLN: 3.0000 mL | RESPIRATORY_TRACT | 6 refills | Status: DC | PRN

## 2017-01-22 MED ORDER — IPRATROPIUM-ALBUTEROL 0.5-2.5 (3) MG/3ML IN SOLN
3.0000 mL | Freq: Once | RESPIRATORY_TRACT | Status: AC
Start: 2017-01-22 — End: 2017-01-22
  Administered 2017-01-22: 3 mL via RESPIRATORY_TRACT

## 2017-01-22 MED ORDER — PREDNISONE 20 MG PO TABS: 40.0000 mg | ORAL_TABLET | Freq: Every day | ORAL | 0 refills | Status: DC

## 2017-01-22 NOTE — Assessment & Plan Note (Signed)
Rx for protonix for epigastric pain.

## 2017-01-22 NOTE — Patient Instructions (Addendum)
We have given you a breathing treatment today. We have put in the order for the nebulizer machine for you to get (we gave you prescription to take to drug store) and the medicine for the machine is there that you can use every 4 hours as needed for breathing.  We are giving you prednisone for the breathing. Take 2 pills daily for 5 days. We are also sending in doxycycline which is an antibiotic for the lungs. Take 1 pill twice a day for 1 week.   We have sent in protonix (pantoprazole) to help with the stomach pain, take 1 pill daily.   We have sent in the pens instead of the vials of the humalog 70/30

## 2017-01-22 NOTE — Assessment & Plan Note (Signed)
Needs prednisone, doxycycline for flare. Rx for nebulizer and duoneb solution to use QID prn. If worsening may need to return for admission. They do wish to try outpatient therapy first as she does not want to go to hospital. She is DNR and reaffirms her wish to not be intubated or resuscitated at all. Would be willing to go to hospital if needed. She does have multiple pulmonary nodules in the past which were thought to be granulomatous however we discussed the possibility that they could have been cancerous and advancing. She did not then and still does not want more workup with CT scan which is reasonable.

## 2017-01-22 NOTE — Progress Notes (Signed)
   Subjective:    Patient ID: [name], female    DOB: 02/22/1917, 84 y.o.   MRN: [id number]  HPI The patient is a 84 YO female coming in for several concerns including her breathing (she has been doing worse lately, using albuterol every 4 hours for weeks now, is not sure if this is a gradual worsening or new change, her son is with her and states that she is worse the last week for sure, no fevers or chills, SOB with even minimal activity, weight is stable), and stomach pain (in the middle, more so at night time, she is not taking omeprazole anymore, her son thinks she has been out but does not recall how long, she denies eating in the evening), and her bladder (having more accidents and some back pain, wants to have her kidney function checked, previously CKD stage 3).   Review of Systems  Constitutional: Positive for activity change, appetite change, chills and fatigue. Negative for fever and unexpected weight change.  HENT: Positive for congestion, postnasal drip, rhinorrhea and sinus pressure. Negative for ear discharge, ear pain, sinus pain, sneezing, sore throat, tinnitus, trouble swallowing and voice change.   Eyes: Negative.   Respiratory: Positive for cough, shortness of breath and wheezing. Negative for chest tightness.   Cardiovascular: Negative.   Gastrointestinal: Negative.   Genitourinary: Positive for flank pain, frequency and urgency. Negative for difficulty urinating, dysuria and enuresis.  Musculoskeletal: Positive for arthralgias, back pain and myalgias.  Skin: Negative.   Neurological: Positive for weakness.      Objective:   Physical Exam  Constitutional: She is oriented to person, place, and time. She appears well-developed and well-nourished.  HENT:  Head: Normocephalic and atraumatic.  Oropharynx with redness and clear drainage, nose with swollen turbinates, TMs normal bilaterally  Eyes: EOM are normal.  Neck: Normal range of motion. No thyromegaly present.    Cardiovascular: Normal rate and regular rhythm.  Pulmonary/Chest: She is in respiratory distress. She has wheezes.  Some SOB with talking but able to speak in full sentences, significant wheezing prior to nebulizer, after duoneb some wheezing still but significantly less  Abdominal: Soft. Bowel sounds are normal. She exhibits no distension. There is tenderness. There is no rebound and no guarding.  Pain in the epigastric region without rebound or guarding  Musculoskeletal: She exhibits tenderness.  Lymphadenopathy:    She has no cervical adenopathy.  Neurological: She is alert and oriented to person, place, and time. Coordination abnormal.  Skin: Skin is warm and dry.   Vitals:   01/22/17 1312  BP: 130/60  Pulse: 78  Temp: (!) 97.5 F (36.4 C)  TempSrc: Oral  SpO2: 98%  Weight: 140 lb (63.5 kg)  Height: 5\' 3"  (1.6 m)      Assessment & Plan:  Duo-neb given at visit

## 2017-01-22 NOTE — Assessment & Plan Note (Signed)
She could not urinate at the office to check U/A. She is taking doxycycline for lungs which should provide some coverage if she has UTI. Checking CMP for stability.

## 2017-01-25 ENCOUNTER — Other Ambulatory Visit: Payer: Self-pay

## 2017-01-25 ENCOUNTER — Telehealth: Payer: Self-pay | Admitting: Internal Medicine

## 2017-01-25 MED ORDER — PANTOPRAZOLE SODIUM 40 MG PO TBEC: 40.0000 mg | DELAYED_RELEASE_TABLET | Freq: Every day | ORAL | 3 refills | Status: AC

## 2017-01-25 MED ORDER — DOXYCYCLINE HYCLATE 100 MG PO TABS: 100.0000 mg | ORAL_TABLET | Freq: Two times a day (BID) | ORAL | 0 refills | Status: DC

## 2017-01-25 MED ORDER — INSULIN ISOPHANE & REGULAR (HUMAN 70-30)100 UNIT/ML KWIKPEN: PEN_INJECTOR | SUBCUTANEOUS | 11 refills | Status: AC

## 2017-01-25 MED ORDER — PREDNISONE 20 MG PO TABS: 40.0000 mg | ORAL_TABLET | Freq: Every day | ORAL | 0 refills | Status: DC

## 2017-01-25 NOTE — Telephone Encounter (Signed)
Resent to [facility]

## 2017-01-25 NOTE — Telephone Encounter (Signed)
Copied from CRM [id number]. Topic: General - Other >> Jan 25, 2017  3:26 PM Linda PaganiniMunoz, Linda Skorupski I, NT wrote: Reason for CRM: Pt son call and said they Hi went to the pharmacy  Get her med, The Pharmacy told him  they problems,with Blue cross and [employer] shield [facility] [address] [phone]

## 2017-01-26 ENCOUNTER — Other Ambulatory Visit: Payer: Self-pay

## 2017-01-26 MED ORDER — AMLODIPINE BESYLATE 10 MG PO TABS: 10.0000 mg | ORAL_TABLET | Freq: Every day | ORAL | 1 refills | Status: AC

## 2017-02-02 ENCOUNTER — Telehealth: Payer: Self-pay | Admitting: Internal Medicine

## 2017-02-02 NOTE — Telephone Encounter (Signed)
Copied from CRM #[id number]. Topic: Quick Communication - Rx Refill/Question >> Feb 02, 2017  3:12 PM [name], Linda B wrote: Medication: ipratropium-albuterol (DUONEB) 0.5-2.5 (3) MG/3ML SOLN [[id number]][[id number]]  Has the patient contacted their pharmacy? Yes.    [facility] of pt called and [facility] [address] b/c the [facility] pharmacy is unable to fill the Rx or transfer it to another pharmacy, contact pt or pharmacy if needed

## 2017-02-03 ENCOUNTER — Other Ambulatory Visit: Payer: Self-pay

## 2017-02-03 MED ORDER — IPRATROPIUM-ALBUTEROL 0.5-2.5 (3) MG/3ML IN SOLN: 3.0000 mL | RESPIRATORY_TRACT | 6 refills | Status: AC | PRN

## 2017-02-03 MED ORDER — IPRATROPIUM-ALBUTEROL 0.5-2.5 (3) MG/3ML IN SOLN: 3.0000 mL | RESPIRATORY_TRACT | 6 refills | Status: DC | PRN

## 2017-02-03 NOTE — Telephone Encounter (Signed)
Resent rx to [facility]../lmb 

## 2017-02-19 ENCOUNTER — Other Ambulatory Visit: Payer: Self-pay

## 2017-02-19 DIAGNOSIS — J441 Chronic obstructive pulmonary disease with (acute) exacerbation: Secondary | ICD-10-CM

## 2017-02-19 MED ORDER — ALBUTEROL SULFATE HFA 108 (90 BASE) MCG/ACT IN AERS: INHALATION_SPRAY | RESPIRATORY_TRACT | 3 refills | Status: AC

## 2017-04-15 ENCOUNTER — Other Ambulatory Visit: Payer: Self-pay | Admitting: Internal Medicine

## 2017-04-19 ENCOUNTER — Other Ambulatory Visit: Payer: Self-pay

## 2017-04-19 MED ORDER — GLUCOSE BLOOD VI STRP: ORAL_STRIP | 6 refills | Status: AC

## 2017-04-26 ENCOUNTER — Encounter: Payer: Self-pay | Admitting: Internal Medicine

## 2017-04-26 ENCOUNTER — Ambulatory Visit: Payer: Medicare Other | Admitting: Internal Medicine

## 2017-04-26 VITALS — BP 134/58 | HR 93 | Temp 98.0°F | Ht 63.0 in | Wt 140.0 lb

## 2017-04-26 DIAGNOSIS — M7989 Other specified soft tissue disorders: Secondary | ICD-10-CM | POA: Diagnosis not present

## 2017-04-26 DIAGNOSIS — R05 Cough: Secondary | ICD-10-CM | POA: Diagnosis not present

## 2017-04-26 DIAGNOSIS — J441 Chronic obstructive pulmonary disease with (acute) exacerbation: Secondary | ICD-10-CM | POA: Diagnosis not present

## 2017-04-26 DIAGNOSIS — R059 Cough, unspecified: Secondary | ICD-10-CM

## 2017-04-26 MED ORDER — GABAPENTIN 300 MG PO CAPS: 300.0000 mg | ORAL_CAPSULE | Freq: Every day | ORAL | 3 refills | Status: AC

## 2017-04-26 MED ORDER — HYDROCODONE-HOMATROPINE 5-1.5 MG/5ML PO SYRP: 5.0000 mL | ORAL_SOLUTION | Freq: Every evening | ORAL | 0 refills | Status: AC | PRN

## 2017-04-26 MED ORDER — FUROSEMIDE 40 MG PO TABS: 40.0000 mg | ORAL_TABLET | Freq: Every day | ORAL | 1 refills | Status: AC

## 2017-04-26 MED ORDER — PREDNISONE 20 MG PO TABS: 40.0000 mg | ORAL_TABLET | Freq: Every day | ORAL | 0 refills | Status: AC

## 2017-04-26 NOTE — Progress Notes (Signed)
Resting room air 90%   Last walking room air 87%

## 2017-04-26 NOTE — Patient Instructions (Addendum)
We have sent in prednisone to take 2 pills daily for 1 week.   We will have you take 2 pills of the fluid pill daily for 1 week to help with the fluid then go back down to 1 pill daily.  We have sent in gabapentin that you can take at night time for the pain in the legs.   We have sent in a refill of the hydrocodone cough medicine.   We will have palliative care come out to the house.

## 2017-04-26 NOTE — Progress Notes (Signed)
   Subjective:    Patient ID: [name], female    DOB: 11/27/1917, 84 y.o.   MRN: [id number]  HPI The patient is a 84 YO female coming in for several concerns including SOB (some wheezing, coughing with mostly non-productive, denies fevers or chills, cannot walk 10 feet without getting SOB, even conversations can get SOB), leg swelling (worse in the evening, has some problems with walking due to pain, taking her lasix 1 pill daily and feels like this makes her urinate, denies change in diet, activity is severely limited), and cough (went away some with doxycycline and prednisone after last visit in January, returned shortly thereafter, hard time getting around at home, some SOB and wheezing associated, coughing non-productive with some white sputum rare, denies fevers or chills).   Review of Systems  Constitutional: Positive for activity change, chills and fatigue. Negative for appetite change.  HENT: Negative.   Eyes: Negative.   Respiratory: Positive for cough, shortness of breath and wheezing. Negative for chest tightness.   Cardiovascular: Positive for leg swelling. Negative for chest pain and palpitations.  Gastrointestinal: Negative for abdominal distention, abdominal pain, constipation, diarrhea, nausea and vomiting.  Musculoskeletal: Positive for gait problem.  Skin: Negative.   Neurological: Positive for weakness.  Psychiatric/Behavioral: Negative.       Objective:   Physical Exam  Constitutional: She is oriented to person, place, and time. She appears well-developed and well-nourished. She appears distressed.  HENT:  Head: Normocephalic and atraumatic.  Eyes: EOM are normal.  Neck: Normal range of motion.  Cardiovascular: Normal rate and regular rhythm.  Pulmonary/Chest: Effort normal. No respiratory distress. She has wheezes. She has no rales.  Some scattered wheezing bilaterally, dyspneic during conversation and with minimal exertion  Abdominal: Soft. Bowel sounds are  normal. She exhibits no distension. There is no tenderness. There is no rebound.  Musculoskeletal: She exhibits edema.  1+ edema bilaterally to mid shins  Neurological: She is alert and oriented to person, place, and time. Coordination normal.  Skin: Skin is warm and dry.  Psychiatric: She has a normal mood and affect.   Vitals:   04/26/17 1434  BP: (!) 134/58  Pulse: 93  Temp: 98 F (36.7 C)  TempSrc: Oral  SpO2: 90%  Weight: 140 lb (63.5 kg)  Height: 5\' 3"  (1.6 m)      Assessment & Plan:

## 2017-04-27 ENCOUNTER — Telehealth: Payer: Self-pay | Admitting: Internal Medicine

## 2017-04-27 DIAGNOSIS — M7989 Other specified soft tissue disorders: Secondary | ICD-10-CM | POA: Insufficient documentation

## 2017-04-27 DIAGNOSIS — R059 Cough, unspecified: Secondary | ICD-10-CM | POA: Insufficient documentation

## 2017-04-27 DIAGNOSIS — R05 Cough: Secondary | ICD-10-CM | POA: Insufficient documentation

## 2017-04-27 NOTE — Assessment & Plan Note (Addendum)
Consistent over the last several months but worsening. Some SOB even with talking during the visit and needs home oxygen. Rx for prednisone for the wheezing to improve lung function. Her COPD is likely worsening. Rx for hydromet cough syrup for better sleeping and [place] narcotic database reviewed and no inappropriate fills.

## 2017-04-27 NOTE — Telephone Encounter (Signed)
Copied from CRM [phone]. Topic: Quick Communication - See Telephone Encounter >> Apr 27, 2017 10:51 AM Rudi CocoLathan, Riki Gehring M, NT wrote: CRM for notification. See Telephone encounter for: 04/27/17.  [employer] calling about med. albuterol (VENTOLIN HFA) 108 (90 Base) MCG/ACT inhaler [[id number]][[id number]] asking about pt. Diagnosis and how long has the pt. Been taking this med. [employer] callback number [phone] opt. 5

## 2017-04-27 NOTE — Assessment & Plan Note (Signed)
Needs home oxygen for desaturation with walking documented in office, suspect some overall worsening of symptoms. Rx for prednisone 1 week course and they will call back if improved. No antibiotic today as more wheezing on exam that focal changes.

## 2017-04-27 NOTE — Telephone Encounter (Signed)
[facility] and how long patient has been on medication

## 2017-04-27 NOTE — Assessment & Plan Note (Signed)
Increase lasix to 2 pills daily for 1 week to help with swelling, some concern for pulmonary edema as well. She does try to keep legs elevated when possible.

## 2017-04-29 ENCOUNTER — Emergency Department (HOSPITAL_COMMUNITY)
Admission: EM | Admit: 2017-04-29 | Discharge: 2017-05-12 | Disposition: E | Payer: Medicare Other | Attending: Emergency Medicine | Admitting: Emergency Medicine

## 2017-04-29 ENCOUNTER — Emergency Department (HOSPITAL_COMMUNITY): Payer: Medicare Other

## 2017-04-29 ENCOUNTER — Encounter (HOSPITAL_COMMUNITY): Payer: Self-pay | Admitting: Emergency Medicine

## 2017-04-29 DIAGNOSIS — R4182 Altered mental status, unspecified: Secondary | ICD-10-CM | POA: Diagnosis present

## 2017-04-29 DIAGNOSIS — J9621 Acute and chronic respiratory failure with hypoxia: Secondary | ICD-10-CM

## 2017-04-29 DIAGNOSIS — I509 Heart failure, unspecified: Secondary | ICD-10-CM | POA: Insufficient documentation

## 2017-04-29 DIAGNOSIS — E872 Acidosis, unspecified: Secondary | ICD-10-CM

## 2017-04-29 DIAGNOSIS — J449 Chronic obstructive pulmonary disease, unspecified: Secondary | ICD-10-CM | POA: Diagnosis not present

## 2017-04-29 DIAGNOSIS — Z79899 Other long term (current) drug therapy: Secondary | ICD-10-CM | POA: Diagnosis not present

## 2017-04-29 DIAGNOSIS — Z7982 Long term (current) use of aspirin: Secondary | ICD-10-CM | POA: Insufficient documentation

## 2017-04-29 DIAGNOSIS — R404 Transient alteration of awareness: Secondary | ICD-10-CM | POA: Diagnosis not present

## 2017-04-29 DIAGNOSIS — J9622 Acute and chronic respiratory failure with hypercapnia: Secondary | ICD-10-CM

## 2017-04-29 DIAGNOSIS — J9601 Acute respiratory failure with hypoxia: Secondary | ICD-10-CM | POA: Diagnosis not present

## 2017-04-29 DIAGNOSIS — Z87891 Personal history of nicotine dependence: Secondary | ICD-10-CM | POA: Insufficient documentation

## 2017-04-29 DIAGNOSIS — E039 Hypothyroidism, unspecified: Secondary | ICD-10-CM | POA: Diagnosis not present

## 2017-04-29 LAB — COMPREHENSIVE METABOLIC PANEL
ALT: 50 U/L (ref 14–54)
ANION GAP: 14 (ref 5–15)
AST: 190 U/L — AB (ref 15–41)
Albumin: 3.7 g/dL (ref 3.5–5.0)
Alkaline Phosphatase: 85 U/L (ref 38–126)
BUN: 38 mg/dL — AB (ref 6–20)
CO2: 22 mmol/L (ref 22–32)
Calcium: 10 mg/dL (ref 8.9–10.3)
Chloride: 101 mmol/L (ref 101–111)
Creatinine, Ser: 1.97 mg/dL — ABNORMAL HIGH (ref 0.44–1.00)
GFR, EST AFRICAN AMERICAN: 23 mL/min — AB (ref >60)
GFR, EST NON AFRICAN AMERICAN: 20 mL/min — AB (ref >60)
Glucose, Bld: 368 mg/dL — ABNORMAL HIGH (ref 65–99)
POTASSIUM: 4.8 mmol/L (ref 3.5–5.1)
Sodium: 137 mmol/L (ref 135–145)
Total Bilirubin: 0.6 mg/dL (ref 0.3–1.2)
Total Protein: 7 g/dL (ref 6.5–8.1)

## 2017-04-29 LAB — CBC WITH DIFFERENTIAL/PLATELET
BASOS ABS: 0 10*3/uL (ref 0.0–0.1)
Basophils Relative: 0 %
EOS ABS: 0 10*3/uL (ref 0.0–0.7)
Eosinophils Relative: 0 %
HCT: 42.8 % (ref 36.0–46.0)
Hemoglobin: 13 g/dL (ref 12.0–15.0)
LYMPHS PCT: 39 %
Lymphs Abs: 12.1 10*3/uL — ABNORMAL HIGH (ref 0.7–4.0)
MCH: 26.5 pg (ref 26.0–34.0)
MCHC: 30.4 g/dL (ref 30.0–36.0)
MCV: 87.3 fL (ref 78.0–100.0)
Monocytes Absolute: 1.6 10*3/uL — ABNORMAL HIGH (ref 0.1–1.0)
Monocytes Relative: 5 %
NEUTROS PCT: 56 %
Neutro Abs: 17.4 10*3/uL — ABNORMAL HIGH (ref 1.7–7.7)
PLATELETS: 323 10*3/uL (ref 150–400)
RBC: 4.9 MIL/uL (ref 3.87–5.11)
RDW: 15.7 % — AB (ref 11.5–15.5)
Smear Review: ADEQUATE
WBC: 31.1 10*3/uL — ABNORMAL HIGH (ref 4.0–10.5)

## 2017-04-29 LAB — BRAIN NATRIURETIC PEPTIDE: B NATRIURETIC PEPTIDE 5: 961.4 pg/mL — AB (ref 0.0–100.0)

## 2017-04-29 LAB — I-STAT CG4 LACTIC ACID, ED: Lactic Acid, Venous: 6.5 mmol/L (ref 0.5–1.9)

## 2017-04-29 LAB — PATHOLOGIST SMEAR REVIEW

## 2017-04-29 LAB — I-STAT TROPONIN, ED: TROPONIN I, POC: 25.83 ng/mL — AB (ref 0.00–0.08)

## 2017-04-29 MED ORDER — METHYLPREDNISOLONE SODIUM SUCC 125 MG IJ SOLR
65.0000 mg | Freq: Once | INTRAMUSCULAR | Status: AC
  Administered 2017-04-29: 65 mg via INTRAVENOUS
  Filled 2017-04-29: qty 2

## 2017-04-29 MED ORDER — HYDROMORPHONE HCL 2 MG/ML IJ SOLN: 1.0000 mg | INTRAMUSCULAR | Status: DC | PRN

## 2017-04-29 MED ORDER — LORAZEPAM 2 MG/ML IJ SOLN
INTRAMUSCULAR | Status: AC
  Filled 2017-04-29: qty 1

## 2017-04-29 MED ORDER — PIPERACILLIN-TAZOBACTAM 3.375 G IVPB 30 MIN: 3.3750 g | Freq: Once | INTRAVENOUS | Status: DC

## 2017-04-29 MED ORDER — HYDROMORPHONE HCL 2 MG/ML IJ SOLN
0.5000 mg | INTRAMUSCULAR | Status: DC | PRN
  Administered 2017-04-29: 0.5 mg via INTRAVENOUS
  Filled 2017-04-29: qty 1

## 2017-04-29 MED ORDER — FUROSEMIDE 10 MG/ML IJ SOLN
80.0000 mg | Freq: Once | INTRAMUSCULAR | Status: DC
  Filled 2017-04-29: qty 8

## 2017-04-29 MED ORDER — VANCOMYCIN HCL 10 G IV SOLR
1250.0000 mg | Freq: Once | INTRAVENOUS | Status: DC
  Filled 2017-04-29: qty 1250

## 2017-04-29 MED ORDER — LORAZEPAM 2 MG/ML IJ SOLN
0.5000 mg | INTRAMUSCULAR | Status: DC | PRN
  Administered 2017-04-29: 0.5 mg via INTRAVENOUS

## 2017-04-29 MED ORDER — HYDROMORPHONE HCL 2 MG/ML IJ SOLN
0.5000 mg | Freq: Once | INTRAMUSCULAR | Status: AC
  Administered 2017-04-29: 0.5 mg via INTRAVENOUS
  Filled 2017-04-29: qty 1

## 2017-04-29 MED ORDER — GLYCOPYRROLATE 0.2 MG/ML IJ SOLN: 0.2000 mg | INTRAMUSCULAR | Status: DC | PRN

## 2017-04-29 MED ORDER — HYDROMORPHONE HCL 2 MG/ML IJ SOLN
1.0000 mg | INTRAMUSCULAR | Status: DC | PRN
  Administered 2017-04-29: 1 mg via INTRAVENOUS
  Filled 2017-04-29: qty 1

## 2017-04-29 MED ORDER — ALBUTEROL (5 MG/ML) CONTINUOUS INHALATION SOLN
10.0000 mg/h | INHALATION_SOLUTION | Freq: Once | RESPIRATORY_TRACT | Status: AC
  Administered 2017-04-29: 10 mg/h via RESPIRATORY_TRACT
  Filled 2017-04-29: qty 20

## 2017-04-29 MED ORDER — IPRATROPIUM BROMIDE 0.02 % IN SOLN
1.0000 mg | Freq: Once | RESPIRATORY_TRACT | Status: AC
  Administered 2017-04-29: 1 mg via RESPIRATORY_TRACT
  Filled 2017-04-29: qty 5

## 2017-05-12 NOTE — ED Triage Notes (Addendum)
Pt arrives by [facility] from home where she lives with her son. Pt woke up this morning and was unable to walk per son due to weakness and "not her normal self". Pt then was unable to eat breakfast and after sitting in her chair became unresponsive. Upon ems arrival sats 57%. NRB applied, pt arrives with gcs-6 o2 sats 98%.

## 2017-05-12 NOTE — ED Notes (Signed)
Pt has became more alert on Bipap, able to sit up and tell me that she needed to urinate, Pure wick was applied. Pt is restless in bed, keeps moving around and pulling at BIPAP.

## 2017-05-12 NOTE — ED Notes (Addendum)
MD Issacs ok with plan to hold off on blood draws and medications until family and palliative finishes their conversation.

## 2017-05-12 NOTE — ED Provider Notes (Signed)
[facility] EMERGENCY DEPARTMENT Provider Note   CSN: [id number] Arrival date & time:        History   Chief Complaint Chief Complaint  Patient presents with  . Altered Mental Status    HPI [name] is a 84 y.o. female.  HPI 83 year old female with past medical history as below including COPD and CHF here with altered mental status.  Patient reportedly has had progressively worsening leg swelling and shortness of breath over the last several days.  She was seen by her doctor and laced on prednisone as well as had increased Lasix dose.  She has had progressively worsening generalized weakness and shortness of breath since then.  Earlier today, patient son was helping her have breakfast and she was unable to eat and very fatigued.  She then became progressively more unresponsive and tachypneic.  Patient has a DNR and has stated that she would desire to die at home, but son became very concerned about her work of breathing and discomfort so called EMS.  Per EMS, patient was satting in the 50s on arrival.  She has been intermittently to minimally responsive throughout the transfer.  Son is on the way.  On my assessment, history limited secondary to altered mental status and respiratory distress.  Level 5 caveat invoked as remainder of history, ROS, and physical exam limited due to patient's respiratory distress, AMS.   Past Medical History:  Diagnosis Date  . CATARACT EXTRACTION, HX OF 09/21/2006  . COLONIC POLYPS, HX OF 09/21/2006  . COPD 09/21/2006  . DIABETES MELLITUS 09/21/2006  . DVT 09/21/2006  . Headache(784.0) 06/13/2007  . HYPERLIPIDEMIA 09/21/2006  . HYPERTENSION 09/21/2006  . HYPOTHYROIDISM 09/21/2006  . HYSTERECTOMY, HX OF 09/21/2006  . NEURALGIA, TRIGEMINAL 10/01/2008  . OSTEOPOROSIS 09/21/2006  . PLANTAR FASCIITIS 09/21/2006  . PRIMARY LOCALIZED OSTEOARTHROSIS HAND 06/13/2007  . THYROIDECTOMY, HX OF 09/21/2006    Patient Active Problem List   Diagnosis Date Noted  .  Leg swelling 04/27/2017  . Cough 04/27/2017  . GERD (gastroesophageal reflux disease) 01/22/2017  . Memory change 01/29/2015  . Renal insufficiency 06/03/2013  . Pulmonary nodules/lesions, multiple 11/01/2012  . DJD (degenerative joint disease) of lumbar spine 01/19/2012  . Routine health maintenance 01/15/2011  . Post-operative hypothyroidism 09/21/2006  . Type 2 diabetes mellitus with vascular disease (HCC) 09/21/2006  . Hyperlipidemia associated with type 2 diabetes mellitus (HCC) 09/21/2006  . Essential hypertension 09/21/2006  . History of DVT (deep vein thrombosis) 09/21/2006  . COPD with exacerbation (HCC) 09/21/2006  . Osteoporosis 09/21/2006    Past Surgical History:  Procedure Laterality Date  . ABDOMINAL HYSTERECTOMY    . CATARACT EXTRACTION    . THYROIDECTOMY     ? partial for goiter     OB History   None      Home Medications    Prior to Admission medications   Medication Sig Start Date End Date Taking? Authorizing Provider  albuterol (VENTOLIN HFA) 108 (90 Base) MCG/ACT inhaler inhale 2 puffs by mouth four times a day 02/19/17  Yes Myrlene Broker, MD  amLODipine (NORVASC) 10 MG tablet Take 1 tablet (10 mg total) by mouth daily. 01/26/17  Yes Myrlene Broker, MD  aspirin 81 MG tablet Take 81 mg by mouth daily.    Yes [provider]  calcium-vitamin D (OSCAL WITH D) 500-200 MG-UNIT per tablet Take 1 tablet by mouth daily.     Yes [provider]  furosemide (LASIX) 40 MG tablet Take  1-2 tablets (40-80 mg total) by mouth daily. Patient taking differently: Take 80 mg by mouth daily.  04/26/17  Yes Myrlene Broker, MD  gabapentin (NEURONTIN) 300 MG capsule Take 1 capsule (300 mg total) by mouth at bedtime. 04/26/17  Yes Myrlene Broker, MD  HYDROcodone-homatropine [facility]) 5-1.5 MG/5ML syrup Take 5-10 mLs by mouth at bedtime as needed for cough. 04/26/17  Yes Myrlene Broker, MD  Insulin Isophane & Regular Human (HUMULIN  70/30 KWIKPEN) (70-30) 100 UNIT/ML PEN 35 units in the morning, 20 units in the evening 01/25/17  Yes Myrlene Broker, MD  ipratropium-albuterol (DUONEB) 0.5-2.5 (3) MG/3ML SOLN Take 3 mLs by nebulization every 4 (four) hours as needed. Diagnosis code: J44.1 02/03/17  Yes Myrlene Broker, MD  levothyroxine (SYNTHROID, LEVOTHROID) 75 MCG tablet TAKE 1 TABLET BY MOUTH ONCE DAILY 01/11/17  Yes Myrlene Broker, MD  losartan (COZAAR) 100 MG tablet take 1 tablet by mouth once daily 07/10/16  Yes Veryl Speak, FNP  Multiple Vitamins-Minerals (MULTIVITAMIN WITH MINERALS) tablet Take 1 tablet by mouth daily.     Yes [provider]  pantoprazole (PROTONIX) 40 MG tablet Take 1 tablet (40 mg total) by mouth daily. 01/25/17  Yes Myrlene Broker, MD  predniSONE (DELTASONE) 20 MG tablet Take 2 tablets (40 mg total) by mouth daily with breakfast. Patient taking differently: Take 80 mg by mouth daily with breakfast.  04/26/17  Yes Myrlene Broker, MD  psyllium (METAMUCIL) 58.6 % powder Take 1 packet by mouth every evening.    Yes [provider]  vitamin E 400 UNIT capsule Take 400 Units by mouth daily.     Yes [provider]  B-D INS SYRINGE 0.5CC/31GX5/16 31G X 5/16" 0.5 ML MISC USE TWICE DAILY 01/31/16   Myrlene Broker, MD  glucose blood (ONE TOUCH ULTRA TEST) test strip USE 1 STRIP TO CHECK GLUCOSE TWICE DAILY AS DIRECTED 04/19/17   Myrlene Broker, MD  NOVOFINE 32G X 6 MM MISC  06/09/13   [provider]    Family History Family History  Problem Relation Age of Onset  . Heart disease Mother        dropsy  . Stroke Mother        < 98  . Lung cancer Son        smoker    Social History Social History   Tobacco Use  . Smoking status: Former Smoker    Last attempt to quit: 01/14/1979    Years since quitting: 38.3  . Smokeless tobacco: Never Used  . Tobacco comment: smoked 10 years, < 1 ppd  Substance Use Topics  . Alcohol  use: No  . Drug use: No     Allergies   Codeine   Review of Systems Review of Systems  Unable to perform ROS: Mental status change     Physical Exam Updated Vital Signs BP 120/75   Pulse (!) 102   Temp (!) 96.4 F (35.8 C) (Temporal)   Resp 16   Wt 63.5 kg (140 lb)   SpO2 92%   BMI 24.80 kg/m   Physical Exam  Constitutional: She appears well-developed. She appears lethargic. She has a sickly appearance. She appears ill. No distress.  HENT:  Head: Normocephalic and atraumatic.  Eyes: Conjunctivae are normal.  Neck: Neck supple.  Cardiovascular: Normal rate, regular rhythm and normal heart sounds. Exam reveals no friction rub.  No murmur heard. Pulmonary/Chest: Accessory muscle usage present. Tachypnea noted.  She is in respiratory distress. She has decreased breath sounds. She has wheezes. She has rales in the right lower field and the left lower field.  Abdominal: She exhibits no distension.  Musculoskeletal: She exhibits edema (2+ pitting).  Neurological: She appears lethargic. She exhibits normal muscle tone. GCS eye subscore is 2. GCS verbal subscore is 2. GCS motor subscore is 4.  Skin: Skin is warm. Capillary refill takes less than 2 seconds.  Psychiatric: She has a normal mood and affect.  Nursing note and vitals reviewed.    ED Treatments / Results  Labs (all labs ordered are listed, but only abnormal results are displayed) Labs Reviewed  CBC WITH DIFFERENTIAL/PLATELET - Abnormal; Notable for the following components:      Result Value   WBC 31.1 (*)    RDW 15.7 (*)    Neutro Abs 17.4 (*)    Lymphs Abs 12.1 (*)    Monocytes Absolute 1.6 (*)    All other components within normal limits  COMPREHENSIVE METABOLIC PANEL - Abnormal; Notable for the following components:   Glucose, Bld 368 (*)    BUN 38 (*)    Creatinine, Ser 1.97 (*)    AST 190 (*)    GFR calc non Af Amer 20 (*)    GFR calc Af Amer 23 (*)    All other components within normal limits    BRAIN NATRIURETIC PEPTIDE - Abnormal; Notable for the following components:   B Natriuretic Peptide 961.4 (*)    All other components within normal limits  I-STAT TROPONIN, ED - Abnormal; Notable for the following components:   Troponin i, poc 25.83 (*)    All other components within normal limits  I-STAT CG4 LACTIC ACID, ED - Abnormal; Notable for the following components:   Lactic Acid, Venous 6.50 (*)    All other components within normal limits  PATHOLOGIST SMEAR REVIEW  I-STAT VENOUS BLOOD GAS, ED    EKG EKG Interpretation  Date/Time:  Thursday April 29 2017 08:55:34 EDT Ventricular Rate:  103 PR Interval:    QRS Duration: 114 QT Interval:  376 QTC Calculation: 493 R Axis:   26 Text Interpretation:  Sinus or ectopic atrial tachycardia Multiform ventricular premature complexes Prolonged PR interval Consider left atrial enlargement Incomplete right bundle branch block ST depression, consider ischemia, lateral lds Borderline prolonged QT interval Since last EKG, PVCs are more evident ST depressions in lateral precordial leads have worsened Confirmed by Shaune PollackIsaacs, Suresh Audi [phone]([id number]) on 2017/10/02 9:18:17 AM   Radiology Dg Chest Portable 1 View  Result Date: 2017/10/02 CLINICAL DATA:  Shortness of breath EXAM: PORTABLE CHEST 1 VIEW COMPARISON:  January 22, 2017 FINDINGS: There is underlying scarring and fibrosis, primarily in the bases. There is interstitial edema with small pleural effusions. There is cardiomegaly with pulmonary venous hypertension. No consolidation. No evident adenopathy. There is aortic atherosclerosis. There is degenerative change in each shoulder. IMPRESSION: Findings felt to be indicative of congestive heart failure superimposed on fibrotic type change. No consolidation. There is aortic atherosclerosis. Aortic Atherosclerosis (ICD10-I70.0). Electronically Signed   By: Bretta BangWilliam  Woodruff III M.D.   On: 02019/09/21 09:29    Procedures .Critical Care Performed by:  Shaune PollackIsaacs, Elisabet Gutzmer, MD Authorized by: Shaune PollackIsaacs, Daleigh Pollinger, MD   Critical care provider statement:    Critical care time (minutes):  75   Critical care time was exclusive of:  Separately billable procedures and treating other patients and teaching time   Critical care was necessary to treat or prevent imminent or  life-threatening deterioration of the following conditions:  Cardiac failure, circulatory failure and respiratory failure   Critical care was time spent personally by me on the following activities:  Development of treatment plan with patient or surrogate, discussions with consultants, evaluation of patient's response to treatment, examination of patient, obtaining history from patient or surrogate, ordering and performing treatments and interventions, ordering and review of laboratory studies, ordering and review of radiographic studies, pulse oximetry, re-evaluation of patient's condition and review of old charts   I assumed direction of critical care for this patient from another provider in my specialty: no     (including critical care time)  Medications Ordered in ED Medications  furosemide (LASIX) injection 80 mg (80 mg Intravenous Not Given 05/27/2017 1123)  glycopyrrolate (ROBINUL) injection 0.2 mg (has no administration in time range)  LORazepam (ATIVAN) injection 0.5 mg (0.5 mg Intravenous Given May 27, 2017 1214)  LORazepam (ATIVAN) 2 MG/ML injection (has no administration in time range)  HYDROmorphone (DILAUDID) injection 1 mg (1 mg Intravenous Given 27-May-2017 1351)  albuterol (PROVENTIL,VENTOLIN) solution continuous neb (10 mg/hr Nebulization Given 05/27/2017 0923)  ipratropium (ATROVENT) nebulizer solution 1 mg (1 mg Nebulization Given 05-27-17 0923)  methylPREDNISolone sodium succinate (SOLU-MEDROL) 125 mg/2 mL injection 65 mg (65 mg Intravenous Given 27-May-2017 1027)  HYDROmorphone (DILAUDID) injection 0.5 mg (0.5 mg Intravenous Given 05-27-17 1214)     Initial Impression / Assessment and  Plan / ED Course  I have reviewed the triage vital signs and the nursing notes.  Pertinent labs & imaging results that were available during my care of the patient were reviewed by me and considered in my medical decision making (see chart for details).  Clinical Course as of Apr 29 1512  Thu 05-27-2017  1072 84 year old female here with severe respiratory distress and hypoxic respiratory failure.  Suspect this is multifactorial secondary to fluid overload, COPD, and possibly pneumonia.  Patient minimally responsive on arrival.  A long discussion with the patient's son, who confirms that patient is DNR and would not like to escalate care.  However, based on discussion with him, will start on BiPAP.  I also placed a consult to palliative as I suspect her current condition is likely not survivable.  Will avoid any invasive testing, but son is interested on labs and treatment at this time.  Will give a empiric dose of Lasix given her tenuous respiratory status, try BiPAP, and get breathing treatments.   [CI]  [id number] Chest x-ray confirms pulmonary edema.  BiPAP and Lasix given.  Awaiting palliative care consult and clinical response.   [CI]  1004 Lab work is concerning for severe lactic acidosis, markedly elevated troponin.  This may all be secondary to her severe CHF versus occult sepsis as well.  Patient given antibiotics, given her tenuous respiratory status will hold on fluids.  Awaiting discussion with palliative.   [CI]  1014 Mental status markedly improved on BiPAP.  Per palliative, holding on further treatment at this time pending their discussion with family.   [CI]  1450 After extensive discussion with hospice as well as with the patient and palliative care, the decision was made to transition to comfort care and transfer off of BiPAP.  Patient expired shortly thereafter.  Family was at bedside.  They are appreciative of care.  Patient pronounced dead by myself. PCP will be made aware.  [CI]     Clinical Course User Index [CI] Shaune Pollack, MD     Final Clinical Impressions(s) / ED Diagnoses  Final diagnoses:  Acute on chronic respiratory failure with hypoxia and hypercapnia (HCC)  Transient alteration of awareness  Lactic acidosis    ED Discharge Orders    None       Shaune Pollack, MD 05-25-17 1515

## 2017-05-12 NOTE — Progress Notes (Signed)
CSW spoke with RN to gather further information on hospice care for pt. CSW was informed that at this time family is speaking with NP about final decisions regarding Bipap as well as other treatments at this time. CS[facility] and was informed that she was aware of pt at this time and has [facility] to determine if pt would appropriate for residential hospice or home with hospice at this time. CSW to speak with NP and then follow up with Carley HammedEva with details.    [name], MSW, LCSW-A Emergency Department Clinical Social Worker [phone]

## 2017-05-12 NOTE — ED Notes (Signed)
Family at bedside. 

## 2017-05-12 NOTE — Progress Notes (Signed)
Visited with family at bedside to provide prayer and emotional support.  Patient passed away. Supported Haematologiststaff and facilitated information sharing between staff and family.  Venida JarvisWatlington, Usiel Astarita, [place], [facility], Pager [phone]

## 2017-05-12 NOTE — Progress Notes (Signed)
CS[name] and was informed [facility] that [facility] are not able to take pt while on BIPAP. CSW spoke with  NP [name] and informed her of this and offered to further assists with hospice needs. Melanie expressed that she would continue to follow pt for residential hospice placement as needed. CSW to remain involved for any further needs at this time.     [name], MSW, LCSW-A Emergency Department Clinical Social Worker [phone]

## 2017-05-12 NOTE — Progress Notes (Signed)
Patient removed off bipap per palliative care nurse and family request. Family at the bedside with patient.

## 2017-05-12 NOTE — Discharge Planning (Signed)
Follow for disposition needs.  

## 2017-05-12 NOTE — ED Notes (Signed)
Palliative Nurse speaking with both Sons at this time.

## 2017-05-12 NOTE — Progress Notes (Signed)
Palliative Medicine RN Note: Consult order noted; patient seen in ED. Met with sons [name] (with whom she has lived for 22 years) and son [name]. Discussed patient with Drs Hilma Favors and Ellender Hose.  [name] and [name] describe a marked decline over the past few weeks including increasing incontinence, increasing weakness (now uses a walker), and the loss of ability to bathe herself. Monday she went to the MD and both got a hospice referral and was started on home O2 d/t ambulatory sat of 87%. Yesterday, she had an episode of leaning backwards and eyes rolling back. She also told her sons that she is ready to die and ready to go home (she was at home; she is ready to die per her sons). Her sons report that she is insistent that she die at home and absolutely did not want to be admitted to the hospital. They have had experience with hospice at home with the death of Terry's ex wife, and they want the same for [name]. However, her sons are uncomfortable providing physical care, specifically baths and peri care. They were in the process of figuring out how to care for her when this happened.   This morning, [name] began to feel worse and was too weak to eat or lift her head. She had previously refused to go to the ED, so when she agreed today, they knew she felt bad. Her sats were in the 50s upon arrival to ED, and she was eventually put on bipap for comfort only, and [name] requested she be a DNR, as that has been her stated wish. PMT was consulted for further [place].   Patient has a hx of CHF, COPD, lifetime asthma (per son), DM, HTN, HLD, leg pain. Sons also report that she was found to have multiple lung nodules in R[facility] & LUL about 5 years ago, and she refused further work up. Today, she has a sat of, initially 57% with shallow respirations before initiation of bipap. WBC >31, BNP 961, trop over 25, lactic acid 6.5.   Initially, pt was reported to be unresponsive, but she was able to wake up after about an hour  on bipap. She is oriented and able to answer questions appropriately. She does get confused at times, but she is able to answer follow up questions when reminded we discussed it. She wants to go home. We discussed the concern of being able to manage her symptoms at home, especially her resp status. She wants her pain managed, and she wants to leave the mask on only for comfort. She explains that she is ready to die and will go to hospice for symptom control.   I spoke with liasons with both [place]. Neither is willing to take her on bipap even with a goal of comfort and a willingness to stop bipap if she becomes comfortable.  I met with patient again with family present. We discussed her overwhelming infection and the use of abx, which would necessitate hospital admission. She DOES NOT want to be admitted and understands that this means she will die. We also discussed the use of the bipap mask. As long as she is comfortable, she is willing to stop the bipap mask; she is scared of having trouble breathing.   Dr Hilma Favors started dilaudid and ativan, and then she increased the hydromorphone when 0.5 mg did not relieve symptoms. Plan is to give IV hydromorphone and lorazepam a chance to work then for me to  return to ED to be present when she is taken off bipap and changed to another O2 deliver method. Both the patient and her family are aware that she may die at this time. [name] is OK with this as long as she can breathe comfortably and her pain in her leg is controlled. Family is tearful but in agreement with plan, as they heard her state what she wants.   Patient loves [employer] and is requesting something to drink, which she cannot have with bipap on. I will bring [employer] when I return to be present when bipap is d/c. Spoke with Dr Hilma Favors; she is ready to give very aggressive orders for comfort when we liberate [name] from bipap.  Time at bedside:  3h32mn  [name] G. Staci Dack, RN, BSN, [place] [facility] PlacePalliative Medicine Team [phone] PM Office [phone]

## 2017-05-12 NOTE — ED Notes (Signed)
Family at bedside with both sons. Pt requesting family to take her home.

## 2017-05-12 NOTE — ED Notes (Signed)
Removed pt off BIPAP per family request and for patient comfort.

## 2017-05-12 NOTE — ED Notes (Signed)
MD Isaacs at bedside, family and pts wishes are for nothing invasive. Agreeable to try BiPAP, with goal of pt being comfortable.

## 2017-05-12 NOTE — Progress Notes (Signed)
Palliative Medicine RN Note: Came to ED to liberate patient from bipap. She had no s/s distress and was transitioned to [place]. Sats dropped to 70's; discussed pt's wishes for a natural death, so she was not changed to a NRB. Her HR and sat continued to drop. RN gave one additional prn dose of Dilaudid. Patient relaxed and respirations became unlabored. At 1405, she was noted to be completely apneic with no heart beat for 2 minutes of auscultation; no O2 sat or pulse picked up on monitor.  Family at bedside; they are tearful but grateful for the care they have rec'd here. Chaplain Ray visited for prayer.  Called [facility]; rule out based on age and sepsis. Ref # [id number]; spoke with Bradly BienenstockMelissa Davis.  Family has my contact information in case they need further assistance.  Margret ChanceMelanie G. Palmer Fahrner, RN, BSN, [facility] Palliative [facility] Team 04/22/2017 2:32 PM Office [phone]

## 2017-05-12 NOTE — ED Notes (Signed)
Time of death per  MD Isaacs 1408.

## 2017-05-12 DEATH — deceased
